# Patient Record
Sex: Female | Born: 1950 | Race: White | Hispanic: No | Marital: Single | Smoking: Never smoker
Health system: Southern US, Community
[De-identification: ages and names within clinical notes are randomized; demographics above are authoritative.]

## PROBLEM LIST (undated history)

## (undated) ENCOUNTER — Ambulatory Visit: Admission: EM | Source: Home / Self Care

## (undated) DIAGNOSIS — F329 Major depressive disorder, single episode, unspecified: Secondary | ICD-10-CM

## (undated) DIAGNOSIS — R519 Headache, unspecified: Secondary | ICD-10-CM

## (undated) DIAGNOSIS — K589 Irritable bowel syndrome without diarrhea: Secondary | ICD-10-CM

## (undated) DIAGNOSIS — E079 Disorder of thyroid, unspecified: Secondary | ICD-10-CM

## (undated) DIAGNOSIS — F32A Depression, unspecified: Secondary | ICD-10-CM

## (undated) DIAGNOSIS — D649 Anemia, unspecified: Secondary | ICD-10-CM

## (undated) DIAGNOSIS — I10 Essential (primary) hypertension: Secondary | ICD-10-CM

## (undated) DIAGNOSIS — E119 Type 2 diabetes mellitus without complications: Secondary | ICD-10-CM

## (undated) DIAGNOSIS — R51 Headache: Secondary | ICD-10-CM

## (undated) DIAGNOSIS — K219 Gastro-esophageal reflux disease without esophagitis: Secondary | ICD-10-CM

## (undated) HISTORY — PX: GASTRIC BYPASS: SHX52

## (undated) HISTORY — PX: THYROID SURGERY: SHX805

## (undated) HISTORY — PX: ABDOMINAL HYSTERECTOMY: SHX81

## (undated) HISTORY — PX: OOPHORECTOMY: SHX86

## (undated) HISTORY — PX: CHOLECYSTECTOMY: SHX55

---

## 2004-01-03 ENCOUNTER — Emergency Department: Payer: Self-pay | Admitting: Emergency Medicine

## 2004-03-22 ENCOUNTER — Emergency Department: Payer: Self-pay | Admitting: General Practice

## 2004-04-19 ENCOUNTER — Ambulatory Visit: Payer: Self-pay | Admitting: Pain Medicine

## 2008-09-06 ENCOUNTER — Ambulatory Visit: Payer: Self-pay | Admitting: Internal Medicine

## 2008-11-03 ENCOUNTER — Ambulatory Visit: Payer: Self-pay

## 2009-12-07 ENCOUNTER — Ambulatory Visit: Payer: Self-pay | Admitting: Family Medicine

## 2010-07-06 ENCOUNTER — Ambulatory Visit: Payer: Self-pay | Admitting: Specialist

## 2010-07-26 ENCOUNTER — Ambulatory Visit: Payer: Self-pay | Admitting: Specialist

## 2010-07-28 ENCOUNTER — Ambulatory Visit: Payer: Self-pay | Admitting: Specialist

## 2010-08-11 ENCOUNTER — Ambulatory Visit: Payer: Self-pay | Admitting: Specialist

## 2010-08-21 ENCOUNTER — Ambulatory Visit: Payer: Self-pay | Admitting: Specialist

## 2010-09-08 ENCOUNTER — Ambulatory Visit: Payer: Self-pay | Admitting: Gastroenterology

## 2010-12-01 ENCOUNTER — Ambulatory Visit: Payer: Self-pay | Admitting: Internal Medicine

## 2011-08-28 ENCOUNTER — Ambulatory Visit: Payer: Self-pay | Admitting: Specialist

## 2011-09-12 ENCOUNTER — Ambulatory Visit: Payer: Self-pay | Admitting: Specialist

## 2011-09-12 LAB — CREATININE, SERUM
Creatinine: 0.72 mg/dL (ref 0.60–1.30)
EGFR (African American): >60
EGFR (Non-African Amer.): >60

## 2011-12-27 ENCOUNTER — Ambulatory Visit: Payer: Self-pay | Admitting: Internal Medicine

## 2012-02-12 ENCOUNTER — Ambulatory Visit: Payer: Self-pay | Admitting: Gastroenterology

## 2012-02-23 ENCOUNTER — Ambulatory Visit: Payer: Self-pay | Admitting: Gastroenterology

## 2012-07-19 ENCOUNTER — Emergency Department: Payer: Self-pay | Admitting: Emergency Medicine

## 2012-09-11 ENCOUNTER — Ambulatory Visit: Payer: Self-pay | Admitting: Internal Medicine

## 2012-11-27 ENCOUNTER — Ambulatory Visit: Payer: Self-pay | Admitting: Gastroenterology

## 2013-07-21 ENCOUNTER — Ambulatory Visit: Payer: Self-pay | Admitting: Internal Medicine

## 2015-03-31 ENCOUNTER — Other Ambulatory Visit: Payer: Self-pay | Admitting: Internal Medicine

## 2015-03-31 DIAGNOSIS — Z1231 Encounter for screening mammogram for malignant neoplasm of breast: Secondary | ICD-10-CM

## 2015-03-31 DIAGNOSIS — Z13820 Encounter for screening for osteoporosis: Secondary | ICD-10-CM

## 2015-04-20 ENCOUNTER — Ambulatory Visit: Payer: Self-pay

## 2015-05-06 ENCOUNTER — Other Ambulatory Visit: Payer: Self-pay

## 2015-05-06 ENCOUNTER — Ambulatory Visit: Payer: Self-pay

## 2015-05-17 ENCOUNTER — Other Ambulatory Visit: Payer: Self-pay

## 2015-05-17 ENCOUNTER — Ambulatory Visit: Payer: Self-pay

## 2015-05-30 IMAGING — CR RIGHT FOREARM - 2 VIEW
1 series · 3 of 3 positions shown · non-contrast
Comparison: none

REASON FOR EXAM: fall
COMMENTS:   LMP: Post-Menopausal

[Series 1: ap · 0.17mm/px · 3 of 3 slices shown]
[im 1/3]
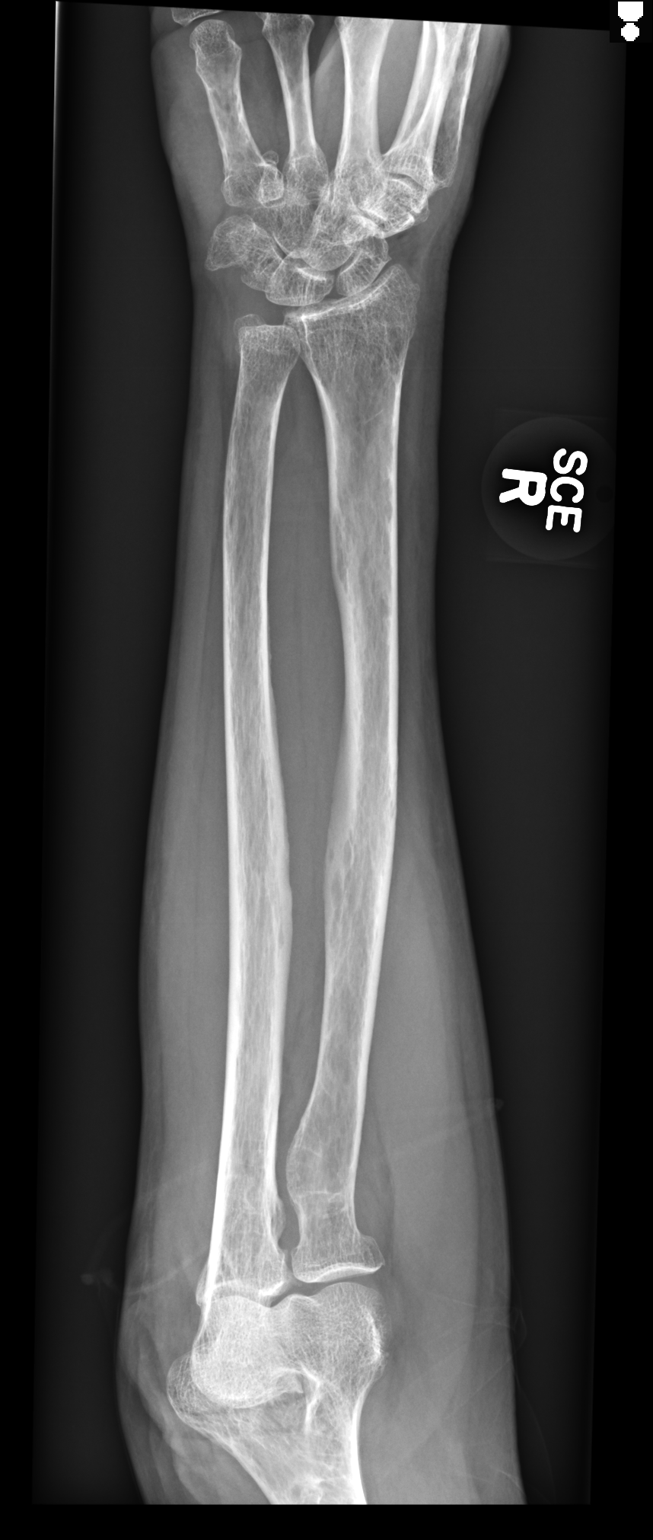
[im 2/3]
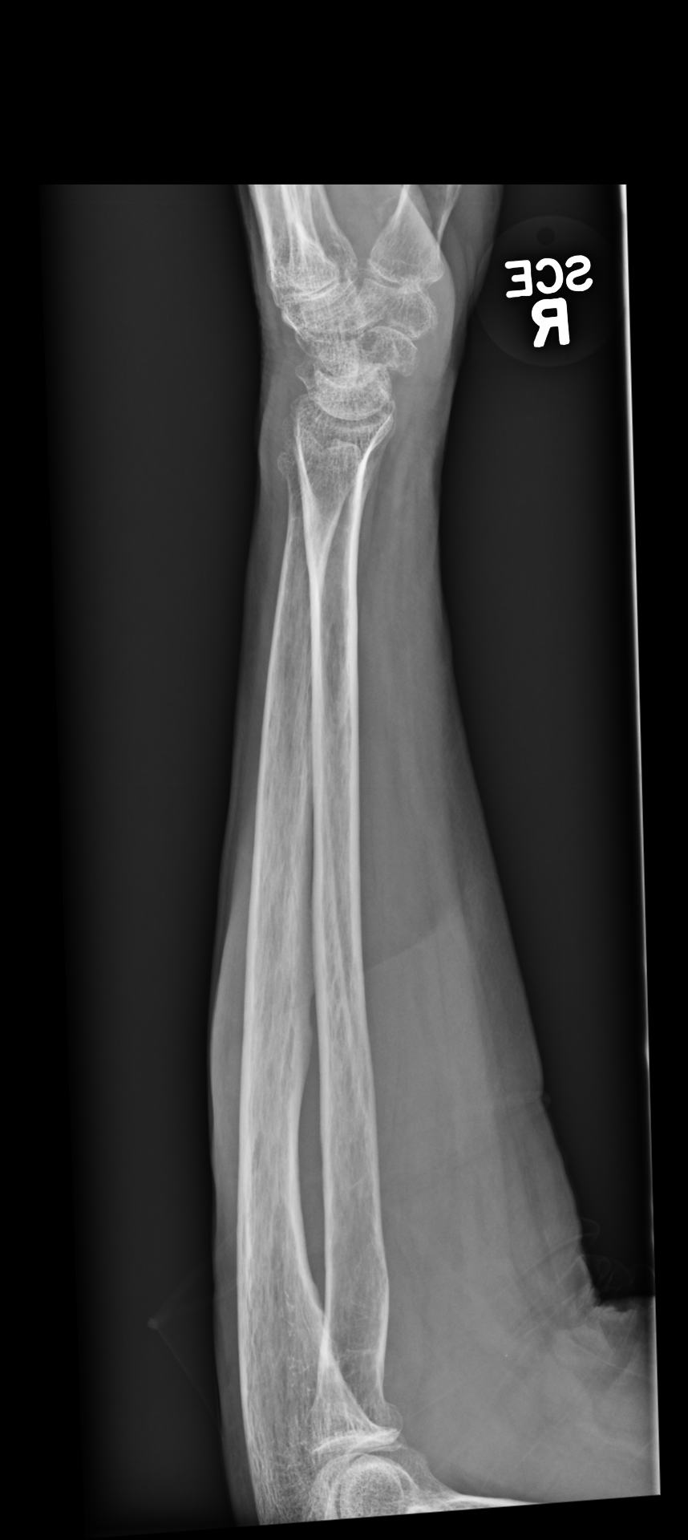
[im 3/3]
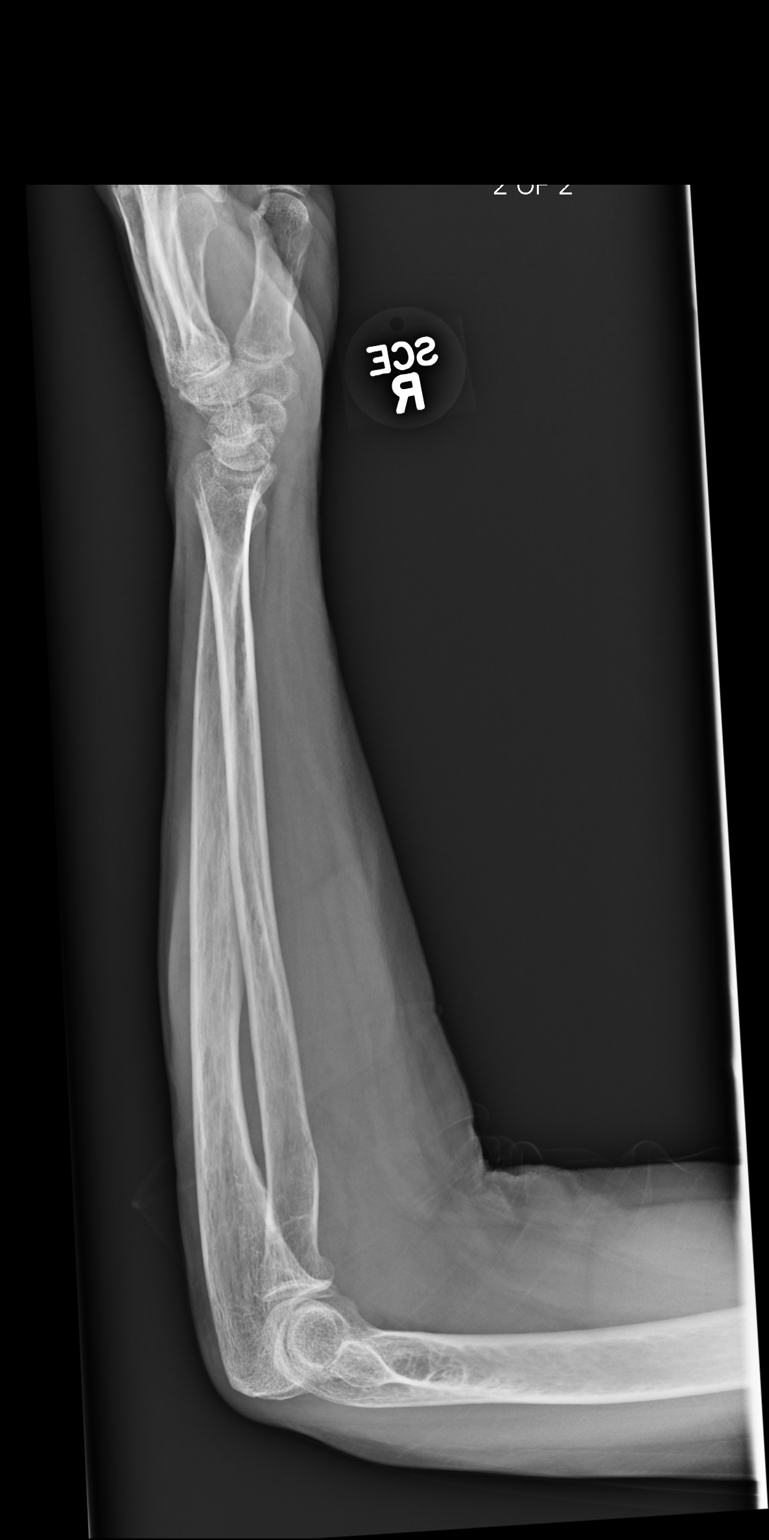

[3 of 3 positions shown; findings below may reference images not displayed]

PROCEDURE:     DXR - DXR FOREARM RIGHT  - July 19, 2012  [DATE]

RESULT:     Images of the right radius and ulna show no definite fracture,
dislocation or radiopaque foreign body. If the patient has symptoms centered
at either the elbow or the wrists, dedicated imaging of those areas is
recommended. Radiocarpal joint space narrowing is present.
IMPRESSION: Please see above.

[REDACTED]

## 2015-09-06 ENCOUNTER — Ambulatory Visit: Admission: RE | Admit: 2015-09-06 | Payer: Self-pay | Source: Ambulatory Visit

## 2016-02-10 ENCOUNTER — Ambulatory Visit: Payer: Self-pay

## 2016-03-23 ENCOUNTER — Ambulatory Visit: Payer: Medicare Other

## 2016-03-23 ENCOUNTER — Encounter: Payer: Self-pay | Admitting: Radiology

## 2016-03-23 ENCOUNTER — Ambulatory Visit
Admission: RE | Admit: 2016-03-23 | Discharge: 2016-03-23 | Disposition: A | Discharge status: Home / Self Care | Payer: Medicare Other | Source: Ambulatory Visit | Attending: Internal Medicine | Admitting: Internal Medicine

## 2016-03-23 ENCOUNTER — Ambulatory Visit
Admission: EM | Admit: 2016-03-23 | Discharge: 2016-03-23 | Disposition: A | Discharge status: Home / Self Care | Payer: Medicare Other | Attending: Family Medicine | Admitting: Family Medicine

## 2016-03-23 ENCOUNTER — Encounter: Payer: Self-pay | Admitting: *Deleted

## 2016-03-23 DIAGNOSIS — K589 Irritable bowel syndrome without diarrhea: Secondary | ICD-10-CM | POA: Diagnosis not present

## 2016-03-23 DIAGNOSIS — Z13820 Encounter for screening for osteoporosis: Secondary | ICD-10-CM | POA: Diagnosis present

## 2016-03-23 DIAGNOSIS — E079 Disorder of thyroid, unspecified: Secondary | ICD-10-CM | POA: Insufficient documentation

## 2016-03-23 DIAGNOSIS — M8580 Other specified disorders of bone density and structure, unspecified site: Secondary | ICD-10-CM | POA: Diagnosis not present

## 2016-03-23 DIAGNOSIS — F329 Major depressive disorder, single episode, unspecified: Secondary | ICD-10-CM | POA: Insufficient documentation

## 2016-03-23 DIAGNOSIS — Z1231 Encounter for screening mammogram for malignant neoplasm of breast: Secondary | ICD-10-CM | POA: Insufficient documentation

## 2016-03-23 DIAGNOSIS — K900 Celiac disease: Secondary | ICD-10-CM | POA: Diagnosis not present

## 2016-03-23 DIAGNOSIS — K219 Gastro-esophageal reflux disease without esophagitis: Secondary | ICD-10-CM | POA: Diagnosis not present

## 2016-03-23 DIAGNOSIS — R296 Repeated falls: Secondary | ICD-10-CM | POA: Insufficient documentation

## 2016-03-23 DIAGNOSIS — S5291XG Unspecified fracture of right forearm, subsequent encounter for closed fracture with delayed healing: Secondary | ICD-10-CM | POA: Diagnosis not present

## 2016-03-23 DIAGNOSIS — Z780 Asymptomatic menopausal state: Secondary | ICD-10-CM | POA: Diagnosis not present

## 2016-03-23 DIAGNOSIS — Z7984 Long term (current) use of oral hypoglycemic drugs: Secondary | ICD-10-CM | POA: Diagnosis not present

## 2016-03-23 DIAGNOSIS — Z833 Family history of diabetes mellitus: Secondary | ICD-10-CM | POA: Insufficient documentation

## 2016-03-23 DIAGNOSIS — Z8249 Family history of ischemic heart disease and other diseases of the circulatory system: Secondary | ICD-10-CM | POA: Diagnosis not present

## 2016-03-23 DIAGNOSIS — W182XXD Fall in (into) shower or empty bathtub, subsequent encounter: Secondary | ICD-10-CM | POA: Diagnosis not present

## 2016-03-23 DIAGNOSIS — S52501G Unspecified fracture of the lower end of right radius, subsequent encounter for closed fracture with delayed healing: Secondary | ICD-10-CM | POA: Diagnosis present

## 2016-03-23 DIAGNOSIS — M810 Age-related osteoporosis without current pathological fracture: Secondary | ICD-10-CM | POA: Insufficient documentation

## 2016-03-23 HISTORY — DX: Major depressive disorder, single episode, unspecified: F32.9

## 2016-03-23 HISTORY — DX: Gastro-esophageal reflux disease without esophagitis: K21.9

## 2016-03-23 HISTORY — DX: Disorder of thyroid, unspecified: E07.9

## 2016-03-23 HISTORY — DX: Irritable bowel syndrome, unspecified: K58.9

## 2016-03-23 HISTORY — DX: Depression, unspecified: F32.A

## 2016-03-23 NOTE — ED Triage Notes (Signed)
Patient injured right wrist on 01/14/16. Wrist is still painful with visible irregularities.

## 2016-03-23 NOTE — ED Provider Notes (Addendum)
[facility]    CSN: [id number] Arrival date & time: 03/23/16  1430     History   Chief Complaint Chief Complaint  Patient presents with  . Wrist Injury    HPI Lisa Livingston is a 66 y.o. female.   Patient reports falling down in her shower about a month ago. She states that she still having difficult to using the right wrist and right hand picking things up she states reports decreased strength and grip about her right hand since then. She is a history of thyroid disease depression and GERD and IBS. Father does have diabetes and heart attack she still smoked. No pertinent family medical history in relationship to this fall   The history is provided by the patient. No language interpreter was used.  Wrist Injury  Location:  Wrist Wrist location:  R wrist Pain details:    Quality:  Sharp   Radiates to:  R wrist   Severity:  Moderate   Duration:  6 weeks   Timing:  Constant   Progression:  Worsening Handedness:  Right-handed Foreign body present:  No foreign bodies Prior injury to area:  Yes Relieved by:  None tried Worsened by:  Nothing Ineffective treatments:  NSAIDs Risk factors: known bone disorder     Past Medical History:  Diagnosis Date  . Depression   . GERD (gastroesophageal reflux disease)   . IBS (irritable bowel syndrome)   . Thyroid disease     There are no active problems to display for this patient.   Past Surgical History:  Procedure Laterality Date  . ABDOMINAL HYSTERECTOMY    . CHOLECYSTECTOMY    . THYROID SURGERY      OB History    No data available       Home Medications    Prior to Admission medications   Medication Sig Start Date End Date Taking? Authorizing Provider  buPROPion (WELLBUTRIN) 75 MG tablet Take 75 mg by mouth daily.   Yes Historical Provider, MD  escitalopram (LEXAPRO) 10 MG tablet Take 10 mg by mouth daily.   Yes Historical Provider, MD  esomeprazole (NEXIUM) 40 MG capsule Take 40 mg by mouth  daily at 12 noon.   Yes Historical Provider, MD  linaclotide (LINZESS) 290 MCG CAPS capsule Take 290 mcg by mouth daily before breakfast.   Yes Historical Provider, MD  loratadine (CLARITIN) 10 MG tablet Take 10 mg by mouth daily.   Yes Historical Provider, MD  metFORMIN (GLUCOPHAGE) 500 MG tablet Take 500 mg by mouth 2 (two) times daily with a meal.   Yes Historical Provider, MD    Family History Family History  Problem Relation Age of Onset  . Diabetes Father   . Heart attack Father   . Breast cancer Neg Hx     Social History Social History  Substance Use Topics  . Smoking status: Never Smoker  . Smokeless tobacco: Never Used  . Alcohol use No     Allergies   Codeine; Milk-related compounds; and Penicillins   Review of Systems Review of Systems  Musculoskeletal: Positive for arthralgias, joint swelling and myalgias.  All other systems reviewed and are negative.    Physical Exam Triage Vital Signs ED Triage Vitals  Enc Vitals Group     BP 03/23/16 1444 117/84     Pulse Rate 03/23/16 1444 74     Resp 03/23/16 1444 16     Temp 03/23/16 1444 98.1 F (36.7 C)  Temp Source 03/23/16 1444 Oral     SpO2 03/23/16 1444 100 %     Weight 03/23/16 1444 146 lb (66.2 kg)     Height 03/23/16 1444 5\' 1"  (1.549 m)     Head Circumference --      Peak Flow --      Pain Score 03/23/16 1454 5     Pain Loc --      Pain Edu? --      Excl. in [place]? --    No data found.   Updated Vital Signs BP 117/84 (BP Location: Left Arm)   Pulse 74   Temp 98.1 F (36.7 C) (Oral)   Resp 16   Ht 5\' 1"  (1.549 m)   Wt 146 lb (66.2 kg)   SpO2 100%   BMI 27.59 kg/m   Visual Acuity Right Eye Distance:   Left Eye Distance:   Bilateral Distance:    Right Eye Near:   Left Eye Near:    Bilateral Near:     Physical Exam  Constitutional: She appears well-developed and well-nourished.  HENT:  Head: Normocephalic.  Eyes: Pupils are equal, round, and reactive to light.  Neck: Normal  range of motion. Neck supple.  Pulmonary/Chest: Effort normal.  Musculoskeletal: She exhibits edema, tenderness and deformity.       Right wrist: She exhibits tenderness and swelling.  We'll x-ray her right wrist and distal forearm . Not worried about navicular fracture since this happened over 4 weeks ago if x-rays negative. Concerned though there is some slight deformity of the wrist area and her muscle tone and strength is diminished  Neurological: She is alert.  Skin: Skin is warm. She is not diaphoretic.  Psychiatric: She has a normal mood and affect.  Vitals reviewed.    UC Treatments / Results  Labs (all labs ordered are listed, but only abnormal results are displayed) Labs Reviewed - No data to display  EKG  EKG Interpretation None       Radiology Dg Wrist Complete Right  Result Date: 03/23/2016 CLINICAL DATA:  Injured wrist in a fall on 02/12/2006 T with persistent pain EXAM: RIGHT WRIST - COMPLETE 3+ VIEW COMPARISON:  Right forearm images of 07/19/2012 FINDINGS: There is subacute transverse slightly impacted fracture of the distal right radius with sclerosis indicating some interval healing. There is very slight ventral angulation at that site. Also there is subacute fracture of the ulnar styloid present. The radiocarpal joint space is unremarkable. The carpal bones are in normal position. The bones are diffusely osteopenic. IMPRESSION: 1. Subacute healing fracture of the distal right radius with slight impaction and mild ventral angulation. 2. Subacute fracture of the ulnar styloid. 3. Osteopenia. Electronically Signed   By: Ivar Drape M.D.   On: 03/23/2016 15:57   Dg Bone Density  Result Date: 03/23/2016 EXAM: DUAL X-RAY ABSORPTIOMETRY (DXA) FOR BONE MINERAL DENSITY IMPRESSION: Dear Lisa Livingston, Your patient Lisa Livingston completed a BMD test on 03/23/2016 using the [employer] (analysis version: 14.10) manufactured by [employer]. The following summarizes  the results of our evaluation. PATIENT BIOGRAPHICAL: Name: Lisa, Livingston Patient ID: [id number] Birth Date: Jan 05, 1951 Height: 61.0 in. Gender: Female Exam Date: 03/23/2016 Weight: 146.7 lbs. Indications: Caucasian, Celiac Disease, Family Hx of Osteoporosis, Hysterectomy, Oophorectomy Bilateral, Parent Hip Fracture, Postmenopausal, Recurrent Falls Fractures: Treatments: Multi-Vitamin with calcium, Vitamin D ASSESSMENT: The BMD measured at Femur Neck Right is 0.676 g/cm2 with a T-score of -2.6. This patient is considered  osteoporotic according to [facility]) criteria. Site Region Measured Measured WHO Young Adult BMD Date       Age      Classification T-score AP Spine L1-L4 2/23/2016 66.6 Osteoporosis -2.6 0.875 g/cm2 DualFemur Neck Right 03/23/2016 66.6 Osteoporosis -2.6 0.676 g/cm2 World Health Organization [facility]) criteria for post-menopausal, Caucasian Women: Normal:       T-score at or above -1 SD Osteopenia:   T-score between -1 and -2.5 SD Osteoporosis: T-score at or below -2.5 SD RECOMMENDATIONS: [place] recommends that FDA-approved medical therapies be considered in postmenopausal women and men age 49 or older with a: 1. Hip or vertebral (clinical or morphometric) fracture. 2. T-score of < -2.5 at the spine or hip. 3. Ten-year fracture probability by FRAX of 3% or greater for hip fracture or 20% or greater for major osteoporotic fracture. All treatment decisions require clinical judgment and consideration of individual patient factors, including patient preferences, co-morbidities, previous drug use, risk factors not captured in the FRAX model (e.g. falls, vitamin D deficiency, increased bone turnover, interval significant decline in bone density) and possible under - or over-estimation of fracture risk by FRAX. All patients should ensure an adequate intake of dietary calcium (1200 mg/d) and vitamin D (800 IU daily) unless contraindicated. FOLLOW-UP: People  with diagnosed cases of osteoporosis or at high risk for fracture should have regular bone mineral density tests. For patients eligible for Medicare, routine testing is allowed once every 2 years. The testing frequency can be increased to one year for patients who have rapidly progressing disease, those who are receiving or discontinuing medical therapy to restore bone mass, or have additional risk factors. I have reviewed this report, and agree with the above findings. [facility]iology Electronically Signed   By: Fidela Salisbury M.D.   On: 03/23/2016 14:15    Procedures Procedures (including critical care time)  Medications Ordered in UC Medications - No data to display   Initial Impression / Assessment and Plan / UC Course  I have reviewed the triage vital signs and the nursing notes.  Pertinent labs & imaging results that were available during my care of the patient were reviewed by me and considered in my medical decision making (see chart for details).    I've explained patient that she has multiple fractures of the right distal forearm. She also has osteopenia. Patient had a bone scan today which she initially did not tell me about. Appearing bone scan was scheduled as an outpatient randomly for today. Explained to her that we will need to get her to orthopedic of her choice to be dilated foot right wrist. We'll place a sugar tong splint and sling for stabilization and wanted the surgery might be needed to get use of that hand and wrist.    Final Clinical Impressions(s) / UC Diagnoses   Final diagnoses:  Multiple closed fractures of forearm with delayed healing, right    New Prescriptions New Prescriptions   No medications on file    Note: This dictation was prepared with Dragon dictation along with smaller phrase technology. Any transcriptional errors that result from this process are unintentional.   Frederich Cha, MD 03/23/16 [place], MD 03/23/16  [phone]

## 2017-03-31 ENCOUNTER — Other Ambulatory Visit: Payer: Self-pay

## 2017-03-31 ENCOUNTER — Ambulatory Visit
Admission: EM | Admit: 2017-03-31 | Discharge: 2017-03-31 | Disposition: A | Discharge status: Home / Self Care | Payer: Medicare Other | Attending: Family Medicine | Admitting: Family Medicine

## 2017-03-31 DIAGNOSIS — K589 Irritable bowel syndrome without diarrhea: Secondary | ICD-10-CM | POA: Diagnosis not present

## 2017-03-31 DIAGNOSIS — Z880 Allergy status to penicillin: Secondary | ICD-10-CM | POA: Diagnosis not present

## 2017-03-31 DIAGNOSIS — R531 Weakness: Secondary | ICD-10-CM | POA: Diagnosis not present

## 2017-03-31 DIAGNOSIS — E079 Disorder of thyroid, unspecified: Secondary | ICD-10-CM | POA: Diagnosis not present

## 2017-03-31 DIAGNOSIS — K219 Gastro-esophageal reflux disease without esophagitis: Secondary | ICD-10-CM | POA: Insufficient documentation

## 2017-03-31 DIAGNOSIS — F329 Major depressive disorder, single episode, unspecified: Secondary | ICD-10-CM | POA: Insufficient documentation

## 2017-03-31 DIAGNOSIS — Z90710 Acquired absence of both cervix and uterus: Secondary | ICD-10-CM | POA: Diagnosis not present

## 2017-03-31 DIAGNOSIS — Z885 Allergy status to narcotic agent status: Secondary | ICD-10-CM | POA: Insufficient documentation

## 2017-03-31 DIAGNOSIS — Z8249 Family history of ischemic heart disease and other diseases of the circulatory system: Secondary | ICD-10-CM | POA: Diagnosis not present

## 2017-03-31 DIAGNOSIS — Z9049 Acquired absence of other specified parts of digestive tract: Secondary | ICD-10-CM | POA: Diagnosis not present

## 2017-03-31 DIAGNOSIS — Z833 Family history of diabetes mellitus: Secondary | ICD-10-CM | POA: Insufficient documentation

## 2017-03-31 DIAGNOSIS — R079 Chest pain, unspecified: Secondary | ICD-10-CM

## 2017-03-31 DIAGNOSIS — Z79899 Other long term (current) drug therapy: Secondary | ICD-10-CM | POA: Insufficient documentation

## 2017-03-31 DIAGNOSIS — Z9884 Bariatric surgery status: Secondary | ICD-10-CM | POA: Diagnosis not present

## 2017-03-31 MED ORDER — ISOSORBIDE MONONITRATE ER 30 MG PO TB24: 30.0000 mg | ORAL_TABLET | Freq: Every day | ORAL | 0 refills | Status: DC

## 2017-03-31 MED ORDER — NITROGLYCERIN 0.4 MG SL SUBL: 0.4000 mg | SUBLINGUAL_TABLET | SUBLINGUAL | 0 refills | Status: DC | PRN

## 2017-03-31 NOTE — ED Provider Notes (Signed)
[facility]    CSN: [id number] Arrival date & time: 03/31/17  1357     History   Chief Complaint Chief Complaint  Patient presents with  . Weakness    HPI Lisa Livingston is a 67 y.o. female.   67 yo female with a c/o intermittent chest tightness on and off for the past week. States she last felt this today in the morning. Currently states she's not having any chest tightness or pressure. Denies shortness of breath, fevers, chills, falls/injuries, rash, nausea/vomiting, GERD, abdominal pain.  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    The history is provided by the patient.  Weakness     Past Medical History:  Diagnosis Date  . Depression   . GERD (gastroesophageal reflux disease)   . IBS (irritable bowel syndrome)   . Thyroid disease     There are no active problems to display for this patient.   Past Surgical History:  Procedure Laterality Date  . ABDOMINAL HYSTERECTOMY    . CHOLECYSTECTOMY    . GASTRIC BYPASS    . THYROID SURGERY      OB History    No data available       Home Medications    Prior to Admission medications   Medication Sig Start Date End Date Taking? Authorizing Provider  buPROPion (WELLBUTRIN) 75 MG tablet Take 75 mg by mouth daily.    [provider]  escitalopram (LEXAPRO) 10 MG tablet Take 10 mg by mouth daily.    [provider]  esomeprazole (NEXIUM) 40 MG capsule Take 40 mg by mouth daily at 12 noon.    [provider]  isosorbide mononitrate (IMDUR) 30 MG 24 hr tablet Take 1 tablet (30 mg total) by mouth daily. 03/31/17   Norval Gable, MD  linaclotide (LINZESS) 290 MCG CAPS capsule Take 290 mcg by mouth daily before breakfast.    [provider]  loratadine (CLARITIN) 10 MG tablet Take 10 mg by mouth daily.    [provider]  metFORMIN (GLUCOPHAGE) 500 MG tablet Take 500 mg by mouth 2 (two) times daily with a meal.    [provider]    nitroGLYCERIN (NITROSTAT) 0.4 MG SL tablet Place 1 tablet (0.4 mg total) under the tongue every 5 (five) minutes as needed for chest pain. 03/31/17   Norval Gable, MD    Family History Family History  Problem Relation Age of Onset  . Diabetes Father   . Heart attack Father   . Breast cancer Neg Hx     Social History Social History   Tobacco Use  . Smoking status: Never Smoker  . Smokeless tobacco: Never Used  Substance Use Topics  . Alcohol use: No  . Drug use: No     Allergies   Codeine; Milk-related compounds; and Penicillins   Review of Systems Review of Systems  Neurological: Positive for weakness.     Physical Exam Triage Vital Signs ED Triage Vitals  Enc Vitals Group     BP 03/31/17 1407 (!) 136/59     Pulse Rate 03/31/17 1407 74     Resp 03/31/17 1407 18     Temp 03/31/17 1407 97.7 F (36.5 C)     Temp Source 03/31/17 1407 Oral     SpO2 03/31/17 1407 100 %     Weight 03/31/17 1417 145 lb (65.8 kg)     Height 03/31/17 1417 5' 1.5" (1.562 m)     Head Circumference --  Peak Flow --      Pain Score 03/31/17 1417 1     Pain Loc --      Pain Edu? --      Excl. in [place]? --    No data found.  Updated Vital Signs BP (!) 136/59 (BP Location: Left Arm)   Pulse 74   Temp 97.7 F (36.5 C) (Oral)   Resp 18   Ht 5' 1.5" (1.562 m)   Wt 145 lb (65.8 kg)   SpO2 100%   BMI 26.95 kg/m   Visual Acuity Right Eye Distance:   Left Eye Distance:   Bilateral Distance:    Right Eye Near:   Left Eye Near:    Bilateral Near:     Physical Exam  Constitutional: She appears well-developed and well-nourished. No distress.  Cardiovascular: Normal rate, regular rhythm, normal heart sounds and intact distal pulses.  Pulmonary/Chest: Effort normal and breath sounds normal. No stridor. No respiratory distress. She has no wheezes. She has no rales.  Abdominal: Soft.  Skin: She is not diaphoretic.  Nursing note and vitals reviewed.    UC Treatments / Results   Labs (all labs ordered are listed, but only abnormal results are displayed) Labs Reviewed - No data to display  EKG  EKG Interpretation None       Radiology No results found.  Procedures ED EKG Date/Time: 03/31/2017 4:16 PM Performed by: Norval Gable, MD Authorized by: Norval Gable, MD   ECG reviewed by ED Physician in the absence of a cardiologist: yes   Previous ECG:    Previous ECG:  Compared to current   Comparison ECG info:  2012   Similarity:  No change Interpretation:    Interpretation: abnormal   Rate:    ECG rate assessment: normal   Rhythm:    Rhythm: sinus rhythm   Ectopy:    Ectopy: none   QRS:    QRS axis:  Normal Conduction:    Conduction: normal   ST segments:    ST segments:  Normal T waves:    T waves: normal   Q waves:    Q waves:  III, aVF and V1    (including critical care time)  Medications Ordered in UC Medications - No data to display   Initial Impression / Assessment and Plan / UC Course  I have reviewed the triage vital signs and the nursing notes.  Pertinent labs & imaging results that were available during my care of the patient were reviewed by me and considered in my medical decision making (see chart for details).       Final Clinical Impressions(s) / UC Diagnoses   Final diagnoses:  Chest pain, unspecified type    ED Discharge Orders        Ordered    isosorbide mononitrate (IMDUR) 30 MG 24 hr tablet  Daily     03/31/17 1527    nitroGLYCERIN (NITROSTAT) 0.4 MG SL tablet  Every 5 min PRN     03/31/17 1527    1. ekg results and diagnosis reviewed with patient 2. rx as per orders above; reviewed possible side effects, interactions, risks and benefits  3. Recommend continue baby ASA daily  4. Follow-up with cardiologist and PCP for further evaluation and management   Controlled Substance Prescriptions [place] Controlled Substance Registry consulted? Not Applicable   Norval Gable, MD 03/31/17 [phone]

## 2017-03-31 NOTE — ED Triage Notes (Addendum)
Pt with weakness and chest tightness and intermittently and describes the pain when it comes as little stabs. Symptoms going on x one week. No aggravating or alleviating factors.

## 2017-03-31 NOTE — Discharge Instructions (Signed)
Follow up with cardiologist

## 2017-04-03 ENCOUNTER — Telehealth: Payer: Self-pay | Admitting: Emergency Medicine

## 2017-04-03 NOTE — Telephone Encounter (Signed)
Called to follow up after patient's recent visit. Patient states she is feeling better and has appointment with Cardiology at [facility] tomorrow.

## 2017-04-04 DIAGNOSIS — R002 Palpitations: Secondary | ICD-10-CM | POA: Insufficient documentation

## 2017-04-04 DIAGNOSIS — R079 Chest pain, unspecified: Secondary | ICD-10-CM | POA: Insufficient documentation

## 2017-05-29 ENCOUNTER — Other Ambulatory Visit: Payer: Self-pay | Admitting: Internal Medicine

## 2017-05-29 DIAGNOSIS — Z1231 Encounter for screening mammogram for malignant neoplasm of breast: Secondary | ICD-10-CM

## 2017-05-30 ENCOUNTER — Encounter: Payer: Self-pay | Admitting: Internal Medicine

## 2017-06-20 ENCOUNTER — Ambulatory Visit
Admission: RE | Admit: 2017-06-20 | Discharge: 2017-06-20 | Disposition: A | Discharge status: Home / Self Care | Payer: Medicare Other | Source: Ambulatory Visit | Attending: Internal Medicine | Admitting: Internal Medicine

## 2017-06-20 DIAGNOSIS — Z1231 Encounter for screening mammogram for malignant neoplasm of breast: Secondary | ICD-10-CM | POA: Insufficient documentation

## 2017-07-23 ENCOUNTER — Other Ambulatory Visit: Payer: Self-pay

## 2017-07-23 ENCOUNTER — Encounter: Payer: Self-pay | Admitting: Gastroenterology

## 2017-07-23 ENCOUNTER — Encounter (INDEPENDENT_AMBULATORY_CARE_PROVIDER_SITE_OTHER): Payer: Self-pay

## 2017-07-23 ENCOUNTER — Ambulatory Visit (INDEPENDENT_AMBULATORY_CARE_PROVIDER_SITE_OTHER): Payer: Medicare Other | Admitting: Gastroenterology

## 2017-07-23 VITALS — BP 140/64 | HR 62 | Ht 61.5 in | Wt 153.0 lb

## 2017-07-23 DIAGNOSIS — K5900 Constipation, unspecified: Secondary | ICD-10-CM | POA: Diagnosis not present

## 2017-07-23 DIAGNOSIS — R112 Nausea with vomiting, unspecified: Secondary | ICD-10-CM

## 2017-07-23 DIAGNOSIS — R194 Change in bowel habit: Secondary | ICD-10-CM | POA: Diagnosis not present

## 2017-07-23 DIAGNOSIS — K432 Incisional hernia without obstruction or gangrene: Secondary | ICD-10-CM | POA: Insufficient documentation

## 2017-07-23 DIAGNOSIS — R140 Abdominal distension (gaseous): Secondary | ICD-10-CM

## 2017-07-23 NOTE — Progress Notes (Signed)
Gastroenterology Consultation  Referring Provider:     Ellamae Sia, MD Primary Care Physician:  Ellamae Sia, MD Primary Gastroenterologist:  Dr. Allen Norris     Reason for Consultation:     Bloating and constipation        HPI:   Lisa Livingston is a 67 y.o. y/o female referred for consultation & management of bloating and constipation by Dr. Quay Burow, Ala Dach, MD.  This patient comes in today for a history of constipation with bloating.  The patient states that she has been taking 290 mcg of Linzess which had been working for some time but now states that she has to supplement it with a laxative pill every morning.  The patient denies any unexplained weight loss.  She states that she has smaller caliber stools.  She also reports that she had gastric bypass surgery in 2012.  She is concerned because she has a lot of nausea and abdominal pain.  She sometimes has to lay down when she has abdominal pain.  The nausea will happen shortly after she eats.  There is no report of any worry symptoms such as black stools or bloody stools.  She also denies any unexplained weight loss. The patient's last colonoscopy was in 2015 and at that time she had multiple tubular adenomas and was told to have a repeat colonoscopy in 1 year.  Past Medical History:  Diagnosis Date  . Depression   . GERD (gastroesophageal reflux disease)   . IBS (irritable bowel syndrome)   . Thyroid disease     Past Surgical History:  Procedure Laterality Date  . ABDOMINAL HYSTERECTOMY    . CHOLECYSTECTOMY    . GASTRIC BYPASS    . OOPHORECTOMY    . THYROID SURGERY      Prior to Admission medications   Medication Sig Start Date End Date Taking? Authorizing Provider  aspirin EC 81 MG tablet Take by mouth.   Yes [provider]  atorvastatin (LIPITOR) 20 MG tablet Take by mouth.   Yes [provider]  Biotin (SUPER BIOTIN) 5 MG TABS Take by mouth.   Yes [provider]  buPROPion  (WELLBUTRIN) 75 MG tablet Take 75 mg by mouth daily.   Yes [provider]  Cholecalciferol (VITAMIN D3) 1000 units CAPS Take by mouth.   Yes [provider]  cyanocobalamin (,VITAMIN B-12,) 1000 MCG/ML injection Inject as directed every thirty (30) days.   Yes [provider]  eletriptan (RELPAX) 40 MG tablet Take by mouth.   Yes [provider]  EPINEPHrine 0.3 mg/0.3 mL IJ SOAJ injection Inject into the skin.   Yes [provider]  escitalopram (LEXAPRO) 10 MG tablet Take 10 mg by mouth daily.   Yes [provider]  esomeprazole (NEXIUM) 40 MG capsule Take 40 mg by mouth daily at 12 noon.   Yes [provider]  ibuprofen (ADVIL,MOTRIN) 200 MG tablet Take by mouth.   Yes [provider]  Lactobacillus Rhamnosus, GG, (CULTURELLE) CAPS Take by mouth.   Yes [provider]  linaclotide (LINZESS) 290 MCG CAPS capsule Take 290 mcg by mouth daily before breakfast.   Yes [provider]  loratadine (CLARITIN) 10 MG tablet Take 10 mg by mouth daily.   Yes [provider]  LORazepam (ATIVAN) 0.5 MG tablet lorazepam 0.5 mg tablet  TK 1 T PO TID PRN   Yes [provider]  metFORMIN (GLUCOPHAGE) 500 MG tablet Take 500 mg  by mouth 2 (two) times daily with a meal.   Yes [provider]  metoprolol tartrate (LOPRESSOR) 50 MG tablet metoprolol tartrate 50 mg tablet  Take 1 tablet twice a day by oral route.   Yes [provider]  Multiple Vitamin (MULTIVITAMIN) capsule Take by mouth.   Yes [provider]  nitroGLYCERIN (NITROSTAT) 0.4 MG SL tablet Place 1 tablet (0.4 mg total) under the tongue every 5 (five) minutes as needed for chest pain. 03/31/17  Yes Norval Gable, MD  ondansetron (ZOFRAN) 4 MG tablet Take by mouth.   Yes [provider]  simvastatin (ZOCOR) 10 MG tablet Take by mouth.   Yes [provider]  traZODone (DESYREL) 50 MG tablet Take by mouth.    Yes [provider]  DOCOSAHEXAENOIC ACID PO Take by mouth.    [provider]  isosorbide mononitrate (IMDUR) 30 MG 24 hr tablet Take 1 tablet (30 mg total) by mouth daily. Patient not taking: Reported on 07/23/2017 03/31/17   Norval Gable, MD  omeprazole (PRILOSEC) 20 MG capsule Take by mouth.    [provider]  Ubiquinol 200 MG CAPS Take by mouth.    [provider]    Family History  Problem Relation Age of Onset  . Diabetes Father   . Heart attack Father   . Breast cancer Neg Hx      Social History   Tobacco Use  . Smoking status: Never Smoker  . Smokeless tobacco: Never Used  Substance Use Topics  . Alcohol use: No  . Drug use: No    Allergies as of 07/23/2017 - Review Complete 07/23/2017  Allergen Reaction Noted  . Aluminum Anaphylaxis 02/10/2013  . Cheese flavor Anaphylaxis 02/10/2013  . Codeine Palpitations 03/23/2016  . Milk-related compounds Anaphylaxis, Diarrhea, and Nausea And Vomiting 02/10/2013  . Molds & smuts Anaphylaxis 02/10/2013  . Oxycodone-acetaminophen Shortness Of Breath 04/04/2017  . Penicillins Hives 03/23/2016  . Sulfa antibiotics Hives and Rash 02/10/2013  . Gluten meal Nausea Only 02/10/2013  . Latex Rash 02/10/2013  . Meperidine Nausea And Vomiting 02/10/2013    Review of Systems:    All systems reviewed and negative except where noted in HPI.   Physical Exam:  BP 140/64   Pulse 62   Ht 5' 1.5" (1.562 m)   Wt 153 lb (69.4 kg)   BMI 28.44 kg/m  No LMP recorded. Patient has had a hysterectomy. Psych:  Alert and cooperative. Normal mood and affect. General:   Alert,  Well-developed, well-nourished, pleasant and cooperative in NAD Head:  Normocephalic and atraumatic. Eyes:  Sclera clear, no icterus.   Conjunctiva pink. Ears:  Normal auditory acuity. Nose:  No deformity, discharge, or lesions. Mouth:  No deformity or lesions,oropharynx pink & moist. Neck:  Supple; no masses or thyromegaly. Lungs:   Respirations even and unlabored.  Clear throughout to auscultation.   No wheezes, crackles, or rhonchi. No acute distress. Heart:  Regular rate and rhythm; no murmurs, clicks, rubs, or gallops. Abdomen:  Normal bowel sounds.  No bruits.  Soft, non-tender and non-distended without masses, hepatosplenomegaly or hernias noted.  No guarding or rebound tenderness.  Negative Carnett sign.   Rectal:  Deferred.  Msk:  Symmetrical without gross deformities.  Good, equal movement & strength bilaterally. Pulses:  Normal pulses noted. Extremities:  No clubbing or edema.  No cyanosis. Neurologic:  Alert and oriented x3;  grossly normal neurologically. Skin:  Intact without significant lesions or rashes.  No jaundice. Lymph Nodes:  No significant cervical adenopathy. Psych:  Alert and cooperative. Normal mood and affect.  Imaging Studies: No results found.  Assessment and Plan:   Lisa Livingston is a 67 y.o. y/o female who has a history of gastric bypass surgery with the loss of 50 pounds after the surgery.  The patient has not continued to lose any weight but reports that she is bloated and has nausea with abdominal pain after eating.  The patient also states that her stools are harder than usual and smaller in caliber.  She also reports that the Linzess that she was taking before has not been working as well as it had in the past. She was last procedure was a colonoscopy by Dr. Candace Cruise with multiple tubular adenomas. The patient has been told to take the [place] in the morning with food which can increase the effectiveness of the Linzess.  She has also been told to stay away from stimulant laxatives and to try MiraLAX daily instead.  The patient has also been told that she will need an EGD and colonoscopy due to her chronic nausea abdominal pain and history of colon polyps. I have discussed risks & benefits which include, but are not limited to, bleeding, infection, perforation & drug reaction.  The patient  agrees with this plan & written consent will be obtained.     Lucilla Lame, MD. Marval Regal   Note: This dictation was prepared with Dragon dictation along with smaller phrase technology. Any transcriptional errors that result from this process are unintentional.

## 2017-07-26 ENCOUNTER — Ambulatory Visit: Payer: Medicare Other | Admitting: Anesthesiology

## 2017-07-26 ENCOUNTER — Encounter: Payer: Self-pay | Admitting: *Deleted

## 2017-07-26 ENCOUNTER — Encounter
Admission: RE | Disposition: A | Discharge status: Home / Self Care | Payer: Self-pay | Source: Ambulatory Visit | Attending: Gastroenterology

## 2017-07-26 ENCOUNTER — Ambulatory Visit
Admission: RE | Admit: 2017-07-26 | Discharge: 2017-07-26 | Disposition: A | Discharge status: Home / Self Care | Payer: Medicare Other | Source: Ambulatory Visit | Attending: Gastroenterology | Admitting: Gastroenterology

## 2017-07-26 DIAGNOSIS — R110 Nausea: Secondary | ICD-10-CM | POA: Insufficient documentation

## 2017-07-26 DIAGNOSIS — Z860100 Personal history of colon polyps, unspecified: Secondary | ICD-10-CM

## 2017-07-26 DIAGNOSIS — I10 Essential (primary) hypertension: Secondary | ICD-10-CM | POA: Insufficient documentation

## 2017-07-26 DIAGNOSIS — Z7984 Long term (current) use of oral hypoglycemic drugs: Secondary | ICD-10-CM | POA: Diagnosis not present

## 2017-07-26 DIAGNOSIS — Z1211 Encounter for screening for malignant neoplasm of colon: Secondary | ICD-10-CM | POA: Insufficient documentation

## 2017-07-26 DIAGNOSIS — D123 Benign neoplasm of transverse colon: Secondary | ICD-10-CM | POA: Insufficient documentation

## 2017-07-26 DIAGNOSIS — R112 Nausea with vomiting, unspecified: Secondary | ICD-10-CM

## 2017-07-26 DIAGNOSIS — Z79899 Other long term (current) drug therapy: Secondary | ICD-10-CM | POA: Insufficient documentation

## 2017-07-26 DIAGNOSIS — Z7982 Long term (current) use of aspirin: Secondary | ICD-10-CM | POA: Diagnosis not present

## 2017-07-26 DIAGNOSIS — K219 Gastro-esophageal reflux disease without esophagitis: Secondary | ICD-10-CM | POA: Diagnosis not present

## 2017-07-26 DIAGNOSIS — R194 Change in bowel habit: Secondary | ICD-10-CM

## 2017-07-26 DIAGNOSIS — G43909 Migraine, unspecified, not intractable, without status migrainosus: Secondary | ICD-10-CM | POA: Insufficient documentation

## 2017-07-26 DIAGNOSIS — K641 Second degree hemorrhoids: Secondary | ICD-10-CM | POA: Insufficient documentation

## 2017-07-26 DIAGNOSIS — Z9884 Bariatric surgery status: Secondary | ICD-10-CM | POA: Insufficient documentation

## 2017-07-26 DIAGNOSIS — K589 Irritable bowel syndrome without diarrhea: Secondary | ICD-10-CM | POA: Diagnosis not present

## 2017-07-26 DIAGNOSIS — E119 Type 2 diabetes mellitus without complications: Secondary | ICD-10-CM | POA: Diagnosis not present

## 2017-07-26 DIAGNOSIS — F329 Major depressive disorder, single episode, unspecified: Secondary | ICD-10-CM | POA: Insufficient documentation

## 2017-07-26 DIAGNOSIS — R140 Abdominal distension (gaseous): Secondary | ICD-10-CM | POA: Diagnosis not present

## 2017-07-26 HISTORY — DX: Anemia, unspecified: D64.9

## 2017-07-26 HISTORY — DX: Essential (primary) hypertension: I10

## 2017-07-26 HISTORY — DX: Headache, unspecified: R51.9

## 2017-07-26 HISTORY — PX: POLYPECTOMY: SHX5525

## 2017-07-26 HISTORY — DX: Type 2 diabetes mellitus without complications: E11.9

## 2017-07-26 HISTORY — DX: Headache: R51

## 2017-07-26 HISTORY — PX: ESOPHAGOGASTRODUODENOSCOPY (EGD) WITH PROPOFOL: SHX5813

## 2017-07-26 HISTORY — PX: COLONOSCOPY WITH PROPOFOL: SHX5780

## 2017-07-26 LAB — GLUCOSE, CAPILLARY: GLUCOSE-CAPILLARY: 170 mg/dL — AB (ref 65–99)

## 2017-07-26 SURGERY — COLONOSCOPY WITH PROPOFOL
Anesthesia: General

## 2017-07-26 MED ORDER — GLYCOPYRROLATE 0.2 MG/ML IJ SOLN
INTRAMUSCULAR | Status: DC | PRN
  Administered 2017-07-26: 0.1 mg via INTRAVENOUS

## 2017-07-26 MED ORDER — ONDANSETRON HCL 4 MG/2ML IJ SOLN
4.0000 mg | Freq: Once | INTRAMUSCULAR | Status: DC | PRN
Start: 2017-07-26 — End: 2017-07-26

## 2017-07-26 MED ORDER — LIDOCAINE HCL (CARDIAC) PF 100 MG/5ML IV SOSY
PREFILLED_SYRINGE | INTRAVENOUS | Status: DC | PRN
  Administered 2017-07-26: 20 mg via INTRAVENOUS

## 2017-07-26 MED ORDER — SODIUM CHLORIDE 0.9 % IV SOLN: INTRAVENOUS | Status: DC

## 2017-07-26 MED ORDER — PROPOFOL 10 MG/ML IV BOLUS
INTRAVENOUS | Status: DC | PRN
Start: 2017-07-26 — End: 2017-07-26
  Administered 2017-07-26 (×2): 20 mg via INTRAVENOUS
  Administered 2017-07-26 (×2): 50 mg via INTRAVENOUS
  Administered 2017-07-26 (×2): 20 mg via INTRAVENOUS
  Administered 2017-07-26: 30 mg via INTRAVENOUS
  Administered 2017-07-26: 60 mg via INTRAVENOUS
  Administered 2017-07-26: 30 mg via INTRAVENOUS
  Administered 2017-07-26 (×2): 20 mg via INTRAVENOUS
  Administered 2017-07-26: 50 mg via INTRAVENOUS
  Administered 2017-07-26 (×2): 20 mg via INTRAVENOUS
  Administered 2017-07-26: 30 mg via INTRAVENOUS
  Administered 2017-07-26: 20 mg via INTRAVENOUS

## 2017-07-26 MED ORDER — ACETAMINOPHEN 160 MG/5ML PO SOLN: 325.0000 mg | ORAL | Status: DC | PRN

## 2017-07-26 MED ORDER — STERILE WATER FOR IRRIGATION IR SOLN
Status: DC | PRN
  Administered 2017-07-26: 11:00:00

## 2017-07-26 MED ORDER — ACETAMINOPHEN 325 MG PO TABS: 650.0000 mg | ORAL_TABLET | Freq: Once | ORAL | Status: DC | PRN

## 2017-07-26 MED ORDER — LACTATED RINGERS IV SOLN
INTRAVENOUS | Status: DC
  Administered 2017-07-26 (×2): via INTRAVENOUS

## 2017-07-26 SURGICAL SUPPLY — 36 items
BALLN DILATOR 10-12 8 (BALLOONS)
BALLN DILATOR 12-15 8 (BALLOONS)
BALLN DILATOR 15-18 8 (BALLOONS)
BALLN DILATOR CRE 0-12 8 (BALLOONS)
BALLN DILATOR ESOPH 8 10 CRE (MISCELLANEOUS) IMPLANT
BALLOON DILATOR 12-15 8 (BALLOONS) IMPLANT
BALLOON DILATOR 15-18 8 (BALLOONS) IMPLANT
BALLOON DILATOR CRE 0-12 8 (BALLOONS) IMPLANT
BLOCK BITE 60FR ADLT L/F GRN (MISCELLANEOUS) ×3 IMPLANT
CANISTER SUCT 1200ML W/VALVE (MISCELLANEOUS) ×3 IMPLANT
CLIP HMST 235XBRD CATH ROT (MISCELLANEOUS) IMPLANT
CLIP RESOLUTION 360 11X235 (MISCELLANEOUS)
ELECT REM PT RETURN 9FT ADLT (ELECTROSURGICAL)
ELECTRODE REM PT RTRN 9FT ADLT (ELECTROSURGICAL) IMPLANT
FCP ESCP3.2XJMB 240X2.8X (MISCELLANEOUS)
FORCEPS BIOP RAD 4 LRG CAP 4 (CUTTING FORCEPS) IMPLANT
FORCEPS BIOP RJ4 240 W/NDL (MISCELLANEOUS)
FORCEPS ESCP3.2XJMB 240X2.8X (MISCELLANEOUS) IMPLANT
GOWN CVR UNV OPN BCK APRN NK (MISCELLANEOUS) ×2 IMPLANT
GOWN ISOL THUMB LOOP REG UNIV (MISCELLANEOUS) ×4
INJECTOR VARIJECT VIN23 (MISCELLANEOUS) IMPLANT
KIT DEFENDO VALVE AND CONN (KITS) IMPLANT
KIT ENDO PROCEDURE OLY (KITS) ×3 IMPLANT
MARKER SPOT ENDO TATTOO 5ML (MISCELLANEOUS) IMPLANT
PROBE APC STR FIRE (PROBE) IMPLANT
RETRIEVER NET PLAT FOOD (MISCELLANEOUS) IMPLANT
RETRIEVER NET ROTH 2.5X230 LF (MISCELLANEOUS) IMPLANT
SNARE SHORT THROW 13M SML OVAL (MISCELLANEOUS) IMPLANT
SNARE SHORT THROW 30M LRG OVAL (MISCELLANEOUS) ×3 IMPLANT
SNARE SNG USE RND 15MM (INSTRUMENTS) IMPLANT
SPOT EX ENDOSCOPIC TATTOO (MISCELLANEOUS)
SYR INFLATION 60ML (SYRINGE) IMPLANT
TRAP ETRAP POLY (MISCELLANEOUS) IMPLANT
VARIJECT INJECTOR VIN23 (MISCELLANEOUS)
WATER STERILE IRR 250ML POUR (IV SOLUTION) ×3 IMPLANT
WIRE CRE 18-20MM 8CM F G (MISCELLANEOUS) IMPLANT

## 2017-07-26 NOTE — H&P (Signed)
Lucilla Lame, MD [address] [place], [place] [address] [phone] Fax : [phone]  Primary Care Physician:  Ellamae Sia, MD Primary Gastroenterologist:  Dr. Allen Norris  Pre-Procedure History & Physical: HPI:  Lisa Livingston is a 67 y.o. female is here for an endoscopy and colonoscopy.   Past Medical History:  Diagnosis Date  . Anemia   . Depression   . Diabetes mellitus without complication ([place])   . GERD (gastroesophageal reflux disease)   . Headache    migraines  . Hypertension   . IBS (irritable bowel syndrome)   . Thyroid disease     Past Surgical History:  Procedure Laterality Date  . ABDOMINAL HYSTERECTOMY    . CHOLECYSTECTOMY    . GASTRIC BYPASS    . OOPHORECTOMY    . THYROID SURGERY      Prior to Admission medications   Medication Sig Start Date End Date Taking? Authorizing Provider  buPROPion (WELLBUTRIN) 75 MG tablet Take 75 mg by mouth daily.   Yes [provider]  eletriptan (RELPAX) 40 MG tablet Take by mouth.   Yes [provider]  escitalopram (LEXAPRO) 10 MG tablet Take 10 mg by mouth daily.   Yes [provider]  esomeprazole (NEXIUM) 40 MG capsule Take 40 mg by mouth daily at 12 noon.   Yes [provider]  ibuprofen (ADVIL,MOTRIN) 200 MG tablet Take by mouth.   Yes [provider]  loratadine (CLARITIN) 10 MG tablet Take 10 mg by mouth daily.   Yes [provider]  metFORMIN (GLUCOPHAGE) 500 MG tablet Take 500 mg by mouth 2 (two) times daily with a meal.   Yes [provider]  metoprolol tartrate (LOPRESSOR) 50 MG tablet metoprolol tartrate 50 mg tablet  Take 1 tablet twice a day by oral route.   Yes [provider]  ondansetron (ZOFRAN) 4 MG tablet Take by mouth.   Yes [provider]  traZODone (DESYREL) 50 MG tablet Take by mouth.   Yes [provider]  aspirin EC 81 MG tablet Take by mouth.    [provider]    atorvastatin (LIPITOR) 20 MG tablet Take by mouth.    [provider]  Biotin (SUPER BIOTIN) 5 MG TABS Take by mouth.    [provider]  Cholecalciferol (VITAMIN D3) 1000 units CAPS Take by mouth.    [provider]  cyanocobalamin (,VITAMIN B-12,) 1000 MCG/ML injection Inject as directed every thirty (30) days.    [provider]  DOCOSAHEXAENOIC ACID PO Take by mouth.    [provider]  EPINEPHrine 0.3 mg/0.3 mL IJ SOAJ injection Inject into the skin.    [provider]  isosorbide mononitrate (IMDUR) 30 MG 24 hr tablet Take 1 tablet (30 mg total) by mouth daily. Patient not taking: Reported on 07/23/2017 03/31/17   Norval Gable, MD  Lactobacillus Rhamnosus, GG, (CULTURELLE) CAPS Take by mouth.    [provider]  linaclotide (LINZESS) 290 MCG CAPS capsule Take 290 mcg by mouth daily before breakfast.    [provider]  LORazepam (ATIVAN) 0.5 MG tablet lorazepam 0.5 mg tablet  TK 1 T PO TID PRN    [provider]  Multiple Vitamin (MULTIVITAMIN) capsule Take by mouth.    [provider]  nitroGLYCERIN (NITROSTAT) 0.4 MG SL tablet Place 1 tablet (0.4 mg total) under the tongue every 5 (five) minutes as needed for chest pain. Patient not taking: Reported on 07/26/2017  03/31/17   Norval Gable, MD  omeprazole (PRILOSEC) 20 MG capsule Take by mouth.    [provider]  simvastatin (ZOCOR) 10 MG tablet Take by mouth.    [provider]  Ubiquinol 200 MG CAPS Take by mouth.    [provider]    Allergies as of 07/23/2017 - Review Complete 07/23/2017  Allergen Reaction Noted  . Aluminum Anaphylaxis 02/10/2013  . Cheese flavor Anaphylaxis 02/10/2013  . Codeine Palpitations 03/23/2016  . Milk-related compounds Anaphylaxis, Diarrhea, and Nausea And Vomiting 02/10/2013  . Molds & smuts Anaphylaxis 02/10/2013  . Oxycodone-acetaminophen Shortness Of Breath 04/04/2017  . Penicillins  Hives 03/23/2016  . Sulfa antibiotics Hives and Rash 02/10/2013  . Gluten meal Nausea Only 02/10/2013  . Latex Rash 02/10/2013  . Meperidine Nausea And Vomiting 02/10/2013    Family History  Problem Relation Age of Onset  . Diabetes Father   . Heart attack Father   . Breast cancer Neg Hx     Social History   Socioeconomic History  . Marital status: Married    Spouse name: Not on file  . Number of children: Not on file  . Years of education: Not on file  . Highest education level: Not on file  Occupational History  . Not on file  Social Needs  . Financial resource strain: Not on file  . Food insecurity:    Worry: Not on file    Inability: Not on file  . Transportation needs:    Medical: Not on file    Non-medical: Not on file  Tobacco Use  . Smoking status: Never Smoker  . Smokeless tobacco: Never Used  Substance and Sexual Activity  . Alcohol use: No  . Drug use: No  . Sexual activity: Not on file  Lifestyle  . Physical activity:    Days per week: Not on file    Minutes per session: Not on file  . Stress: Not on file  Relationships  . Social connections:    Talks on phone: Not on file    Gets together: Not on file    Attends religious service: Not on file    Active member of club or organization: Not on file    Attends meetings of clubs or organizations: Not on file    Relationship status: Not on file  . Intimate partner violence:    Fear of current or ex partner: Not on file    Emotionally abused: Not on file    Physically abused: Not on file    Forced sexual activity: Not on file  Other Topics Concern  . Not on file  Social History Narrative  . Not on file    Review of Systems: See HPI, otherwise negative ROS  Physical Exam: BP 112/60   Pulse 67   Temp 97.7 F (36.5 C) (Temporal)   Resp 16   Ht 5' 1.5" (1.562 m)   Wt 149 lb 1.6 oz (67.6 kg)   SpO2 99%   BMI 27.72 kg/m  General:   Alert,  pleasant and cooperative in NAD Head:   Normocephalic and atraumatic. Neck:  Supple; no masses or thyromegaly. Lungs:  Clear throughout to auscultation.    Heart:  Regular rate and rhythm. Abdomen:  Soft, nontender and nondistended. Normal bowel sounds, without guarding, and without rebound.   Neurologic:  Alert and  oriented x4;  grossly normal neurologically.  Impression/Plan: Lisa Livingston is here for an endoscopy and colonoscopy to be performed for  nausea, bloating and history of colon polyps  Risks, benefits, limitations, and alternatives regarding  colonoscopy have been reviewed with the patient.  Questions have been answered.  All parties agreeable.   Lucilla Lame, MD  07/26/2017, 9:59 AM

## 2017-07-26 NOTE — Op Note (Signed)
[facility] Gastroenterology Patient Name: Lisa Livingston Procedure Date: 07/26/2017 10:38 AM MRN: [id number] Account #: [id number] Date of Birth: Jan 26, 1951 Admit Type: Outpatient Age: 67 Room: [facility] OR ROOM 01 Gender: Female Note Status: Finalized Procedure:            Colonoscopy Indications:          High risk colon cancer surveillance: Personal history                        of colonic polyps Providers:            Lucilla Lame MD, MD Medicines:            Propofol per Anesthesia Complications:        No immediate complications. Procedure:            Pre-Anesthesia Assessment:                       - Prior to the procedure, a History and Physical was                        performed, and patient medications and allergies were                        reviewed. The patient's tolerance of previous                        anesthesia was also reviewed. The risks and benefits of                        the procedure and the sedation options and risks were                        discussed with the patient. All questions were                        answered, and informed consent was obtained. Prior                        Anticoagulants: The patient has taken no previous                        anticoagulant or antiplatelet agents. ASA Grade                        Assessment: II - A patient with mild systemic disease.                        After reviewing the risks and benefits, the patient was                        deemed in satisfactory condition to undergo the                        procedure.                       After obtaining informed consent, the colonoscope was                        passed under direct vision. Throughout  the procedure,                        the patient's blood pressure, pulse, and oxygen                        saturations were monitored continuously. The Olympus                        Colonoscope 190 [id number]) was introduced through the                         anus and advanced to the the cecum, identified by                        appendiceal orifice and ileocecal valve. The                        colonoscopy was performed without difficulty. The                        patient tolerated the procedure well. The quality of                        the bowel preparation was fair. Findings:      The perianal and digital rectal examinations were normal.      A 6 mm polyp was found in the transverse colon. The polyp was sessile.       The polyp was removed with a cold snare. Resection and retrieval were       complete.      Non-bleeding internal hemorrhoids were found during retroflexion. The       hemorrhoids were Grade II (internal hemorrhoids that prolapse but reduce       spontaneously). Impression:           - Preparation of the colon was fair.                       - One 6 mm polyp in the transverse colon, removed with                        a cold snare. Resected and retrieved.                       - Non-bleeding internal hemorrhoids. Recommendation:       - Discharge patient to home.                       - Resume previous diet.                       - Continue present medications.                       - Await pathology results.                       - Repeat colonoscopy in 5 years for surveillance. Procedure Code(s):    --- Professional ---                       [id number], Colonoscopy, flexible; with removal of tumor(s),  polyp(s), or other lesion(s) by snare technique Diagnosis Code(s):    --- Professional ---                       Z86.010, Personal history of colonic polyps                       D12.3, Benign neoplasm of transverse colon (hepatic                        flexure or splenic flexure) CPT copyright 2017 American Medical Association. All rights reserved. The codes documented in this report are preliminary and upon coder review may  be revised to meet current compliance  requirements. Lucilla Lame MD, MD 07/26/2017 11:16:01 AM This report has been signed electronically. Number of Addenda: 0 Note Initiated On: 07/26/2017 10:38 AM Scope Withdrawal Time: 0 hours 10 minutes 7 seconds  Total Procedure Duration: 0 hours 16 minutes 41 seconds       [facility]

## 2017-07-26 NOTE — Transfer of Care (Signed)
Immediate Anesthesia Transfer of Care Note  Patient: Lisa Livingston  Procedure(s) Performed: COLONOSCOPY WITH PROPOFOL (N/A ) ESOPHAGOGASTRODUODENOSCOPY (EGD) WITH PROPOFOL (N/A ) POLYPECTOMY (N/A )  Patient Location: PACU  Anesthesia Type: General  Level of Consciousness: awake, alert  and patient cooperative  Airway and Oxygen Therapy: Patient Spontanous Breathing and Patient connected to supplemental oxygen  Post-op Assessment: Post-op Vital signs reviewed, Patient's Cardiovascular Status Stable, Respiratory Function Stable, Patent Airway and No signs of Nausea or vomiting  Post-op Vital Signs: Reviewed and stable  Complications: No apparent anesthesia complications

## 2017-07-26 NOTE — Anesthesia Postprocedure Evaluation (Signed)
Anesthesia Post Note  Patient: Lisa Livingston  Procedure(s) Performed: COLONOSCOPY WITH PROPOFOL (N/A ) ESOPHAGOGASTRODUODENOSCOPY (EGD) WITH PROPOFOL (N/A ) POLYPECTOMY (N/A )  Patient location during evaluation: PACU Anesthesia Type: General Level of consciousness: awake and alert, oriented and patient cooperative Pain management: pain level controlled Vital Signs Assessment: post-procedure vital signs reviewed and stable Respiratory status: spontaneous breathing, nonlabored ventilation and respiratory function stable Cardiovascular status: blood pressure returned to baseline and stable Postop Assessment: adequate PO intake Anesthetic complications: no    Darrin Nipper

## 2017-07-26 NOTE — Op Note (Signed)
[facility] Gastroenterology Patient Name: Lisa Livingston Procedure Date: 07/26/2017 10:38 AM MRN: [id number] Account #: [id number] Date of Birth: Jul 11, 1950 Admit Type: Outpatient Age: 67 Room: [facility] OR ROOM 01 Gender: Female Note Status: Finalized Procedure:            Upper GI endoscopy Indications:          Nausea Providers:            Lucilla Lame MD, MD Referring MD:         Remus Blake MD, MD (Referring MD) Medicines:            Propofol per Anesthesia Complications:        No immediate complications. Procedure:            Pre-Anesthesia Assessment:                       - Prior to the procedure, a History and Physical was                        performed, and patient medications and allergies were                        reviewed. The patient's tolerance of previous                        anesthesia was also reviewed. The risks and benefits of                        the procedure and the sedation options and risks were                        discussed with the patient. All questions were                        answered, and informed consent was obtained. Prior                        Anticoagulants: The patient has taken no previous                        anticoagulant or antiplatelet agents. ASA Grade                        Assessment: II - A patient with mild systemic disease.                        After reviewing the risks and benefits, the patient was                        deemed in satisfactory condition to undergo the                        procedure.                       After obtaining informed consent, the endoscope was                        passed under direct vision. Throughout the procedure,  the patient's blood pressure, pulse, and oxygen                        saturations were monitored continuously. The Olympus                        190 Endoscope [id number]) was introduced through the   mouth, and advanced to the second part of duodenum. The                        upper GI endoscopy was accomplished without difficulty.                        The patient tolerated the procedure well. Findings:      The examined esophagus was normal.      Evidence of a gastric bypass was found. A gastric pouch with a normal       size was found. The staple line appeared intact. The gastrojejunal       anastomosis was characterized by an intact staple line. This was       traversed. Removal of a staple was accomplished with a regular forceps.      The examined jejunum was normal. Impression:           - Normal esophagus.                       - Gastric bypass with a normal-sized pouch and intact                        staple line. Gastrojejunal anastomosis characterized by                        an intact staple line.                       - Normal examined jejunum. Recommendation:       - Discharge patient to home.                       - Resume previous diet.                       - Continue present medications.                       - Perform a colonoscopy today. Procedure Code(s):    --- Professional ---                       [phone], Esophagogastroduodenoscopy, flexible, transoral;                        with removal of foreign body(s) Diagnosis Code(s):    --- Professional ---                       R11.0, Nausea CPT copyright 2017 American Medical Association. All rights reserved. The codes documented in this report are preliminary and upon coder review may  be revised to meet current compliance requirements. Lucilla Lame MD, MD 07/26/2017 10:54:46 AM This report has been signed electronically. Number of Addenda: 0 Note Initiated On: 07/26/2017 10:38 AM      [facility]

## 2017-07-26 NOTE — Anesthesia Preprocedure Evaluation (Signed)
Anesthesia Evaluation  Patient identified by MRN, date of birth, ID band Patient awake    Reviewed: Allergy & Precautions, NPO status , Patient's Chart, lab work & pertinent test results  History of Anesthesia Complications Negative for: history of anesthetic complications  Airway Mallampati: IV  TM Distance: >3 FB Neck ROM: Full    Dental  (+) Partial Upper,    Pulmonary neg pulmonary ROS,    Pulmonary exam normal breath sounds clear to auscultation       Cardiovascular Exercise Tolerance: Good hypertension, Normal cardiovascular exam Rhythm:Regular Rate:Normal  Palpitations (normal echo and Holter)   Neuro/Psych  Headaches, PSYCHIATRIC DISORDERS Depression    GI/Hepatic GERD  ,IBS; gastric bypass 2012   Endo/Other  diabetes, Type 2  Renal/GU negative Renal ROS     Musculoskeletal   Abdominal   Peds  Hematology  (+) Blood dyscrasia, anemia ,   Anesthesia Other Findings   Reproductive/Obstetrics                             Anesthesia Physical Anesthesia Plan  ASA: II  Anesthesia Plan: General   Post-op Pain Management:    Induction: Intravenous  PONV Risk Score and Plan: 2 and Propofol infusion and TIVA  Airway Management Planned: Natural Airway  Additional Equipment:   Intra-op Plan:   Post-operative Plan:   Informed Consent: I have reviewed the patients History and Physical, chart, labs and discussed the procedure including the risks, benefits and alternatives for the proposed anesthesia with the patient or authorized representative who has indicated his/her understanding and acceptance.     Plan Discussed with: CRNA  Anesthesia Plan Comments:         Anesthesia Quick Evaluation

## 2017-07-26 NOTE — Anesthesia Procedure Notes (Signed)
Procedure Name: MAC Date/Time: 07/26/2017 10:46 AM Performed by: Lind Guest, CRNA Pre-anesthesia Checklist: Patient identified, Emergency Drugs available, Suction available, Timeout performed and Patient being monitored Patient Re-evaluated:Patient Re-evaluated prior to induction Oxygen Delivery Method: Nasal cannula

## 2017-07-27 ENCOUNTER — Encounter: Payer: Self-pay | Admitting: Gastroenterology

## 2017-07-30 ENCOUNTER — Encounter: Payer: Self-pay | Admitting: Gastroenterology

## 2017-07-31 ENCOUNTER — Encounter: Payer: Self-pay | Admitting: Gastroenterology

## 2018-07-30 ENCOUNTER — Encounter: Payer: Self-pay | Admitting: *Deleted

## 2019-06-08 ENCOUNTER — Ambulatory Visit
Admission: EM | Admit: 2019-06-08 | Discharge: 2019-06-08 | Disposition: A | Discharge status: Home / Self Care | Payer: Medicare Other | Attending: Family Medicine | Admitting: Family Medicine

## 2019-06-08 ENCOUNTER — Encounter: Payer: Self-pay | Admitting: Emergency Medicine

## 2019-06-08 ENCOUNTER — Ambulatory Visit: Payer: Medicare Other

## 2019-06-08 ENCOUNTER — Other Ambulatory Visit: Payer: Self-pay

## 2019-06-08 DIAGNOSIS — M79605 Pain in left leg: Secondary | ICD-10-CM | POA: Diagnosis not present

## 2019-06-08 MED ORDER — GABAPENTIN 300 MG PO CAPS: ORAL_CAPSULE | ORAL | 0 refills | Status: AC

## 2019-06-08 NOTE — Discharge Instructions (Signed)
Medication as directed.  Tylenol as needed.  See Princella Ion.  Take care  Dr. Lacinda Axon

## 2019-06-08 NOTE — ED Triage Notes (Signed)
Patient in today c/o left lower leg pain x 2 weeks. No injury noted. Patient said it started 2 days after getting the Moderna vaccine.

## 2019-06-08 NOTE — ED Provider Notes (Signed)
[facility]    CSN: [id number] Arrival date & time: 06/08/19  1411  History   Chief Complaint Chief Complaint  Patient presents with  . Leg Pain    left lower   HPI 70 year old female presents with left leg pain.  Patient reports that she has had pain in her left lower extremity since 3/18.  She states that this started 2 days after receiving Covid vaccine.  Patient localizes pain to the lateral aspect of the left lower leg.  No calf tenderness.  No redness or swelling.  Patient states that the area feels like it is numb and tingling.  She compares it to when local anesthesia wears off.  Pain 7/10 in severity.  She has taken Advil without relief.  She has yet to see her primary care physician for this.  No other associated symptoms.  No other complaints.  Past Medical History:  Diagnosis Date  . Anemia   . Depression   . Diabetes mellitus without complication ([place])   . GERD (gastroesophageal reflux disease)   . Headache    migraines  . Hypertension   . IBS (irritable bowel syndrome)   . Thyroid disease     Patient Active Problem List   Diagnosis Date Noted  . Intractable vomiting with nausea   . Personal history of colonic polyps   . Benign neoplasm of transverse colon   . Incisional hernia 07/23/2017  . Chest pain syndrome 04/04/2017  . Heart palpitations 04/04/2017    Past Surgical History:  Procedure Laterality Date  . ABDOMINAL HYSTERECTOMY    . CHOLECYSTECTOMY    . COLONOSCOPY WITH PROPOFOL N/A 07/26/2017   Procedure: COLONOSCOPY WITH PROPOFOL;  Surgeon: Lucilla Lame, MD;  Location: [place];  Service: Endoscopy;  Laterality: N/A;  . ESOPHAGOGASTRODUODENOSCOPY (EGD) WITH PROPOFOL N/A 07/26/2017   Procedure: ESOPHAGOGASTRODUODENOSCOPY (EGD) WITH PROPOFOL;  Surgeon: Lucilla Lame, MD;  Location: [place];  Service: Endoscopy;  Laterality: N/A;  . GASTRIC BYPASS    . OOPHORECTOMY    . POLYPECTOMY N/A 07/26/2017   Procedure: POLYPECTOMY;   Surgeon: Lucilla Lame, MD;  Location: [place];  Service: Endoscopy;  Laterality: N/A;  . THYROID SURGERY      OB History   No obstetric history on file.      Home Medications    Prior to Admission medications   Medication Sig Start Date End Date Taking? Authorizing Provider  aspirin EC 81 MG tablet Take by mouth.   Yes [provider]  atorvastatin (LIPITOR) 20 MG tablet Take by mouth.   Yes [provider]  Biotin (SUPER BIOTIN) 5 MG TABS Take by mouth.   Yes [provider]  buPROPion (WELLBUTRIN) 75 MG tablet Take 75 mg by mouth daily.   Yes [provider]  Cholecalciferol (VITAMIN D3) 1000 units CAPS Take by mouth.   Yes [provider]  cyanocobalamin (,VITAMIN B-12,) 1000 MCG/ML injection Inject as directed every thirty (30) days.   Yes [provider]  eletriptan (RELPAX) 40 MG tablet Take by mouth.   Yes [provider]  EPINEPHrine 0.3 mg/0.3 mL IJ SOAJ injection Inject into the skin.   Yes [provider]  escitalopram (LEXAPRO) 10 MG tablet Take 10 mg by mouth daily.   Yes [provider]  esomeprazole (NEXIUM) 40 MG capsule Take 40 mg by mouth daily at 12 noon.   Yes [provider]  ibuprofen (ADVIL,MOTRIN) 200 MG tablet Take by mouth.   Yes  [provider]  Lactobacillus Rhamnosus, GG, (CULTURELLE) CAPS Take by mouth.   Yes [provider]  linaclotide (LINZESS) 290 MCG CAPS capsule Take 290 mcg by mouth daily before breakfast.   Yes [provider]  loratadine (CLARITIN) 10 MG tablet Take 10 mg by mouth daily.   Yes [provider]  LORazepam (ATIVAN) 0.5 MG tablet lorazepam 0.5 mg tablet  TK 1 T PO TID PRN   Yes [provider]  metFORMIN (GLUCOPHAGE) 500 MG tablet Take 500 mg by mouth 2 (two) times daily with a meal.   Yes [provider]  metoprolol tartrate (LOPRESSOR) 50 MG tablet metoprolol tartrate 50 mg  tablet  Take 1 tablet twice a day by oral route.   Yes [provider]  Multiple Vitamin (MULTIVITAMIN) capsule Take by mouth.   Yes [provider]  ondansetron (ZOFRAN) 4 MG tablet Take by mouth.   Yes [provider]  simvastatin (ZOCOR) 10 MG tablet Take by mouth.   Yes [provider]  traZODone (DESYREL) 50 MG tablet Take by mouth.   Yes [provider]  isosorbide mononitrate (IMDUR) 30 MG 24 hr tablet Take 1 tablet (30 mg total) by mouth daily. 03/31/17 06/08/19 Yes Conty, Linward Foster, MD  gabapentin (NEURONTIN) 300 MG capsule 300 mg nightly. May increase to 300 mg three times daily 06/08/19   Coral Spikes, DO  nitroGLYCERIN (NITROSTAT) 0.4 MG SL tablet Place 1 tablet (0.4 mg total) under the tongue every 5 (five) minutes as needed for chest pain. Patient not taking: Reported on 07/26/2017 03/31/17 06/08/19  Norval Gable, MD  omeprazole (PRILOSEC) 20 MG capsule Take by mouth.  06/08/19  [provider]    Family History Family History  Problem Relation Age of Onset  . Diabetes Father   . Heart attack Father   . Breast cancer Neg Hx     Social History Social History   Tobacco Use  . Smoking status: Never Smoker  . Smokeless tobacco: Never Used  Substance Use Topics  . Alcohol use: No  . Drug use: No     Allergies   Aluminum, Cheese flavor, Codeine, Milk-related compounds, Molds & smuts, Oxycodone-acetaminophen, Penicillins, Sulfa antibiotics, Gluten meal, Latex, and Meperidine   Review of Systems Review of Systems  Cardiovascular: Negative for leg swelling.  Musculoskeletal:       Leg pain.   Physical Exam Triage Vital Signs ED Triage Vitals  Enc Vitals Group     BP 06/08/19 1425 122/65     Pulse Rate 06/08/19 1425 82     Resp 06/08/19 1425 18     Temp 06/08/19 1425 98 F (36.7 C)     Temp Source 06/08/19 1425 Oral     SpO2 06/08/19 1425 99 %     Weight 06/08/19 1426 148 lb (67.1 kg)     Height 06/08/19 1426 5'  1.5" (1.562 m)     Head Circumference --      Peak Flow --      Pain Score 06/08/19 1425 7     Pain Loc --      Pain Edu? --      Excl. in [place]? --    Updated Vital Signs BP 122/65 (BP Location: Left Arm)   Pulse 82   Temp 98 F (36.7 C) (Oral)   Resp 18   Ht 5' 1.5" (1.562 m)   Wt 67.1 kg   SpO2 99%   BMI 27.51 kg/m  Visual Acuity Right Eye Distance:   Left Eye Distance:   Bilateral Distance:    Right Eye Near:   Left Eye Near:    Bilateral Near:     Physical Exam Vitals and nursing note reviewed.  Constitutional:      General: She is not in acute distress.    Appearance: Normal appearance. She is not ill-appearing.  HENT:     Head: Normocephalic and atraumatic.  Eyes:     General:        Right eye: No discharge.        Left eye: No discharge.     Conjunctiva/sclera: Conjunctivae normal.  Cardiovascular:     Rate and Rhythm: Normal rate and regular rhythm.     Heart sounds: No murmur.  Pulmonary:     Effort: Pulmonary effort is normal.     Breath sounds: No wheezing, rhonchi or rales.  Musculoskeletal:     Comments: No appreciable abnormalities of the left lower leg.  No calf tenderness.  No swelling.  Neurological:     Mental Status: She is alert.  Psychiatric:        Mood and Affect: Mood normal.        Behavior: Behavior normal.     UC Treatments / Results  Labs (all labs ordered are listed, but only abnormal results are displayed) Labs Reviewed - No data to display  EKG   Radiology DG Tibia/Fibula Left  Result Date: 06/08/2019 CLINICAL DATA:  Left lower leg pain for 2 weeks. EXAM: LEFT TIBIA AND FIBULA - 2 VIEW COMPARISON:  None. FINDINGS: Cortical margins of the tibia and fibula are intact. There is no evidence of fracture or other focal bone lesions. Bones appear under mineralized. Mild osteoarthritis of the knee. Soft tissues are unremarkable. IMPRESSION: No acute findings or explanation for lower leg pain. Electronically Signed   By: Keith Rake M.D.   On: 06/08/2019 15:12    Procedures Procedures (including critical care time)  Medications Ordered in UC Medications - No data to display  Initial Impression / Assessment and Plan / UC Course  I have reviewed the triage vital signs and the nursing notes.  Pertinent labs & imaging results that were available during my care of the patient were reviewed by me and considered in my medical decision making (see chart for details).    69 year old female presents with pain of her left lower extremity.  X-ray obtained and was reviewed independently by me.  Interpretation: No acute abnormalities.  Normal x-ray.  Etiology and prognosis of her pain is unclear at this time.  She describes neuropathic pain.  There is no evidence of DVT.  Placing on gabapentin.  Advise follow-up with primary care physician.  Final Clinical Impressions(s) / UC Diagnoses   Final diagnoses:  Left leg pain     Discharge Instructions     Medication as directed.  Tylenol as needed.  See Princella Ion.  Take care  Dr. Lacinda Axon     ED Prescriptions    Medication Sig Dispense Auth. Provider   gabapentin (NEURONTIN) 300 MG capsule 300 mg nightly. May increase to 300 mg three times daily 90 capsule Thersa Salt G, DO     PDMP not reviewed this encounter.   Coral Spikes, [place] 06/08/19 2246

## 2019-06-10 ENCOUNTER — Telehealth: Payer: Self-pay | Admitting: Family Medicine

## 2019-06-10 MED ORDER — GABAPENTIN 300 MG PO CAPS: 300.0000 mg | ORAL_CAPSULE | Freq: Three times a day (TID) | ORAL | 0 refills | Status: AC

## 2019-06-10 NOTE — Telephone Encounter (Signed)
Pt called and states the rx for Gabapentin is $86 at [facility]. Pt would like rx sent to [place] instead.   Please advise. Thanks!

## 2019-08-18 ENCOUNTER — Other Ambulatory Visit: Payer: Self-pay | Admitting: Internal Medicine

## 2019-08-18 DIAGNOSIS — Z1231 Encounter for screening mammogram for malignant neoplasm of breast: Secondary | ICD-10-CM

## 2019-08-18 DIAGNOSIS — M5416 Radiculopathy, lumbar region: Secondary | ICD-10-CM

## 2019-10-21 ENCOUNTER — Ambulatory Visit
Admission: RE | Admit: 2019-10-21 | Discharge: 2019-10-21 | Disposition: A | Discharge status: Home / Self Care | Payer: Medicare Other | Source: Ambulatory Visit | Attending: Internal Medicine | Admitting: Internal Medicine

## 2019-10-21 ENCOUNTER — Other Ambulatory Visit: Payer: Self-pay

## 2019-10-21 DIAGNOSIS — M5416 Radiculopathy, lumbar region: Secondary | ICD-10-CM | POA: Diagnosis present

## 2022-04-20 ENCOUNTER — Ambulatory Visit
Admission: EM | Admit: 2022-04-20 | Discharge: 2022-04-20 | Disposition: A | Discharge status: Home / Self Care | Payer: Medicare PPO | Attending: Emergency Medicine | Admitting: Emergency Medicine

## 2022-04-20 ENCOUNTER — Ambulatory Visit: Payer: Medicare PPO

## 2022-04-20 ENCOUNTER — Ambulatory Visit (INDEPENDENT_AMBULATORY_CARE_PROVIDER_SITE_OTHER): Payer: Medicare PPO

## 2022-04-20 DIAGNOSIS — S42215A Unspecified nondisplaced fracture of surgical neck of left humerus, initial encounter for closed fracture: Secondary | ICD-10-CM | POA: Diagnosis not present

## 2022-04-20 NOTE — ED Triage Notes (Signed)
Pt presents to UC for fall x45 minutes ago DOI: 04/20/22 pt states she was walking into den with her plate of food and fell forward. Pt c/o pain from LT wrist into LT elbow and LT shoulder pain, pt denies any LOC

## 2022-04-20 NOTE — Discharge Instructions (Addendum)
As we discussed, you broke your humerus at the shoulder.  I want you to wear the sling to take the weight off your arm and allow your shoulder to heal.  You can take the gabapentin that you have been previously prescribed up to 3 times a day as needed for pain.  You may supplement this with over-the-counter Tylenol up to 1000 mg every 6 hours.  Call [facility] clinic orthopedics tomorrow and make an appointment to arrange follow-up for your humeral neck fracture.  If you have any sharp increase in pain, swelling of your arm, or develop numbness or tingling of your fingers please go to the ER for evaluation.

## 2022-04-20 NOTE — ED Provider Notes (Signed)
[facility]    CSN: [id number] Arrival date & time: 04/20/22  1803      History   Chief Complaint Chief Complaint  Patient presents with   Fall   Shoulder Pain    LT   Arm Pain    LT    HPI Lisa Livingston is a 72 y.o. female.   HPI  72 year old female here for evaluation of left arm pain.  Patient has a significant past medical history to include thyroid disease, IBS, GERD, hypertension, diabetes, depression, and anemia presenting for evaluation of pain in her left shoulder, left elbow, and left wrist after suffering a ground-level fall just prior to arrival.  She states that she was walking into her den with a plate of food and she tripped and fell onto her left side.  She states that her left cheek did grazed the carpet but she did not strike her head and she denies a loss of consciousness.  She has no headache, change in vision, and she denies numbness or tingling in her fingers.  She did take 300 mg of gabapentin for pain in her left arm.  She is holding it flexed at the elbow at 90 degrees across her body for comfort.  Past Medical History:  Diagnosis Date   Anemia    Depression    Diabetes mellitus without complication (H[facility])    GERD (gastroesophageal reflux disease)    Headache    migraines   Hypertension    IBS (irritable bowel syndrome)    Thyroid disease     Patient Active Problem List   Diagnosis Date Noted   Intractable vomiting with nausea    Personal history of colonic polyps    Benign neoplasm of transverse colon    Incisional hernia 07/23/2017   Chest pain syndrome 04/04/2017   Heart palpitations 04/04/2017    Past Surgical History:  Procedure Laterality Date   ABDOMINAL HYSTERECTOMY     CHOLECYSTECTOMY     COLONOSCOPY WITH PROPOFOL N/A 07/26/2017   Procedure: COLONOSCOPY WITH PROPOFOL;  Surgeon: Lucilla Lame, MD;  Location: [place];  Service: Endoscopy;  Laterality: N/A;   ESOPHAGOGASTRODUODENOSCOPY (EGD) WITH  PROPOFOL N/A 07/26/2017   Procedure: ESOPHAGOGASTRODUODENOSCOPY (EGD) WITH PROPOFOL;  Surgeon: Lucilla Lame, MD;  Location: [place];  Service: Endoscopy;  Laterality: N/A;   GASTRIC BYPASS     OOPHORECTOMY     POLYPECTOMY N/A 07/26/2017   Procedure: POLYPECTOMY;  Surgeon: Lucilla Lame, MD;  Location: [place];  Service: Endoscopy;  Laterality: N/A;   THYROID SURGERY      OB History   No obstetric history on file.      Home Medications    Prior to Admission medications   Medication Sig Start Date End Date Taking? Authorizing Provider  aspirin EC 81 MG tablet Take by mouth.    [provider]  atorvastatin (LIPITOR) 20 MG tablet Take by mouth.    [provider]  Biotin (SUPER BIOTIN) 5 MG TABS Take by mouth.    [provider]  buPROPion (WELLBUTRIN) 75 MG tablet Take 75 mg by mouth daily.    [provider]  Cholecalciferol (VITAMIN D3) 1000 units CAPS Take by mouth.    [provider]  cyanocobalamin (,VITAMIN B-12,) 1000 MCG/ML injection Inject as directed every thirty (30) days.    [provider]  eletriptan (RELPAX) 40 MG tablet Take by mouth.    [provider]  EPINEPHrine 0.3 mg/0.3 mL  IJ SOAJ injection Inject into the skin.    [provider]  escitalopram (LEXAPRO) 10 MG tablet Take 10 mg by mouth daily.    [provider]  esomeprazole (NEXIUM) 40 MG capsule Take 40 mg by mouth daily at 12 noon.    [provider]  gabapentin (NEURONTIN) 300 MG capsule 300 mg nightly. May increase to 300 mg three times daily 06/08/19   Coral Spikes, DO  gabapentin (NEURONTIN) 300 MG capsule Take 1 capsule (300 mg total) by mouth 3 (three) times daily. 06/10/19   Coral Spikes, DO  ibuprofen (ADVIL,MOTRIN) 200 MG tablet Take by mouth.    [provider]  Lactobacillus Rhamnosus, GG, (CULTURELLE) CAPS Take by mouth.    [provider]  linaclotide (LINZESS) 290 MCG  CAPS capsule Take 290 mcg by mouth daily before breakfast.    [provider]  loratadine (CLARITIN) 10 MG tablet Take 10 mg by mouth daily.    [provider]  LORazepam (ATIVAN) 0.5 MG tablet lorazepam 0.5 mg tablet  TK 1 T PO TID PRN    [provider]  metFORMIN (GLUCOPHAGE) 500 MG tablet Take 500 mg by mouth 2 (two) times daily with a meal.    [provider]  metoprolol tartrate (LOPRESSOR) 50 MG tablet metoprolol tartrate 50 mg tablet  Take 1 tablet twice a day by oral route.    [provider]  Multiple Vitamin (MULTIVITAMIN) capsule Take by mouth.    [provider]  ondansetron (ZOFRAN) 4 MG tablet Take by mouth.    [provider]  simvastatin (ZOCOR) 10 MG tablet Take by mouth.    [provider]  traZODone (DESYREL) 50 MG tablet Take by mouth.    [provider]  isosorbide mononitrate (IMDUR) 30 MG 24 hr tablet Take 1 tablet (30 mg total) by mouth daily. 03/31/17 06/08/19  Norval Gable, MD  nitroGLYCERIN (NITROSTAT) 0.4 MG SL tablet Place 1 tablet (0.4 mg total) under the tongue every 5 (five) minutes as needed for chest pain. Patient not taking: Reported on 07/26/2017 03/31/17 06/08/19  Norval Gable, MD  omeprazole (PRILOSEC) 20 MG capsule Take by mouth.  06/08/19  [provider]    Family History Family History  Problem Relation Age of Onset   Diabetes Father    Heart attack Father    Breast cancer Neg Hx     Social History Social History   Tobacco Use   Smoking status: Never   Smokeless tobacco: Never  Vaping Use   Vaping Use: Never used  Substance Use Topics   Alcohol use: No   Drug use: No     Allergies   Aluminum, Cheese flavor, Codeine, Milk-related compounds, Molds & smuts, Oxycodone-acetaminophen, Penicillins, Sulfa antibiotics, Gluten meal, Latex, and Meperidine   Review of Systems Review of Systems  Musculoskeletal:  Positive for arthralgias. Negative for joint  swelling.  Skin:  Negative for color change.  Neurological:  Negative for weakness and numbness.     Physical Exam Triage Vital Signs ED Triage Vitals [04/20/22 1851]  Enc Vitals Group     BP (!) 152/80     Pulse Rate 67     Resp      Temp 98.1 F (36.7 C)     Temp Source Oral     SpO2 100 %     Weight 145 lb (65.8 kg)     Height 5' 1.5" (1.562 m)     Head Circumference  Peak Flow      Pain Score 8     Pain Loc      Pain Edu?      Excl. in [place]?    No data found.  Updated Vital Signs BP (!) 152/80 (BP Location: Right Arm)   Pulse 67   Temp 98.1 F (36.7 C) (Oral)   Ht 5' 1.5" (1.562 m)   Wt 145 lb (65.8 kg)   SpO2 100%   BMI 26.95 kg/m   Visual Acuity Right Eye Distance:   Left Eye Distance:   Bilateral Distance:    Right Eye Near:   Left Eye Near:    Bilateral Near:     Physical Exam Vitals and nursing note reviewed.  Constitutional:      Appearance: Normal appearance.  Musculoskeletal:        General: Tenderness and signs of injury present. No swelling or deformity.  Skin:    General: Skin is warm and dry.     Capillary Refill: Capillary refill takes less than 2 seconds.     Findings: No bruising or erythema.  Neurological:     General: No focal deficit present.     Mental Status: She is alert and oriented to person, place, and time.      UC Treatments / Results  Labs (all labs ordered are listed, but only abnormal results are displayed) Labs Reviewed - No data to display  EKG   Radiology DG Wrist Complete Left  Result Date: 04/20/2022 CLINICAL DATA:  Pain after fall EXAM: LEFT WRIST - COMPLETE 4 VIEW COMPARISON:  None FINDINGS: Slight negative ulnar variance. Vascular calcifications. No fracture or dislocation. Preserved joint spaces. Severe osteopenia. With this level of osteopenia sternal nondisplaced injury is difficult to completely exclude and if needed treatment with follow-up imaging in 7-10 days to assess for occult abnormality.  IMPRESSION: Negative ulnar variance. No acute osseous abnormality severe osteopenia. Vascular calcifications. Electronically Signed   By: Jill Side M.D.   On: 04/20/2022 19:39   DG Elbow Complete Left  Result Date: 04/20/2022 CLINICAL DATA:  Fall EXAM: LEFT ELBOW - COMPLETE 3+ VIEW COMPARISON:  None Available. FINDINGS: Bones are osteopenic. There is no evidence of fracture, dislocation, or joint effusion. There is no evidence of arthropathy or other focal bone abnormality. Soft tissues are unremarkable. IMPRESSION: Negative. Electronically Signed   By: Ronney Asters M.D.   On: 04/20/2022 19:39   DG Shoulder Left  Result Date: 04/20/2022 CLINICAL DATA:  Fall EXAM: LEFT SHOULDER - 2+ VIEW COMPARISON:  None. FINDINGS: Bones are osteopenic. There is a comminuted fracture of the left humeral neck and greater tuberosity. Greater tuberosity is displaced laterally 7 mm. Neck fracture is in near anatomic alignment. There is no dislocation. There is soft tissue swelling surrounding the fracture. IMPRESSION: Comminuted fracture of the left humeral neck and greater tuberosity. Electronically Signed   By: Ronney Asters M.D.   On: 04/20/2022 19:38    Procedures Procedures (including critical care time)  Medications Ordered in UC Medications - No data to display  Initial Impression / Assessment and Plan / UC Course  I have reviewed the triage vital signs and the nursing notes.  Pertinent labs & imaging results that were available during my care of the patient were reviewed by me and considered in my medical decision making (see chart for details).   Patient is a very pleasant, nontoxic-appearing 72 year old female who suffered a ground-level fall just prior to arrival and landed on her  left arm on a carpeted surface.  She is complaining of pain from her wrist to her shoulder.  She is holding her left arm with the elbow flexed at 90 degrees tight to her body.  She can supinate her wrist to 90 degrees but no  further.  She will not externally rotate her arm or extend her elbow.  Radial and ulnar pulses are 2+.  She has 5/5 grip strength in the left hand.  Her tenderness is with palpation of the proximal ulna and radius as well as palpation of the olecranon process.  Pain with compression of medial lateral condyle of the humerus and with palpation of the shaft of the humerus.  Also pain with palpation of the entirety of the glenohumeral joint and AC joint.  There is no visible bruising, edema, or erythema.  I will obtain radiographs of the left shoulder, left elbow, and left wrist to rule out any bony abnormality.  Radiology impression of right shoulder films states there is a comminuted fracture of the left humeral neck and greater tuberosity.  Radiology impression of left elbow states that the bones are osteopenic but there is no evidence of fracture, dislocation, or joint effusion.  Impression of left wrist states negative ulnar variance but no acute osseous abnormality.  Severe osteopenia.  Vascular calcifications.  I will order the patient to be put into a sling and discharged home with a diagnosis of left humeral fracture.  I will give her contact information for orthopedics at [facility] clinic for her to follow-up on her comminuted humeral neck fracture.  The patient reports that she is unable to take any prescription pain medication.  She does have gabapentin that has been previously prescribed and I will have her continue to take that up to 3 times a day as needed for pain.  She can supplement this with over-the-counter Tylenol.   Final Clinical Impressions(s) / UC Diagnoses   Final diagnoses:  Closed nondisplaced fracture of surgical neck of left humerus, unspecified fracture morphology, initial encounter     Discharge Instructions      As we discussed, you broke your humerus at the shoulder.  I want you to wear the sling to take the weight off your arm and allow your shoulder to  heal.  You can take the gabapentin that you have been previously prescribed up to 3 times a day as needed for pain.  You may supplement this with over-the-counter Tylenol up to 1000 mg every 6 hours.  Call [facility] clinic orthopedics tomorrow and make an appointment to arrange follow-up for your humeral neck fracture.  If you have any sharp increase in pain, swelling of your arm, or develop numbness or tingling of your fingers please go to the ER for evaluation.     ED Prescriptions   None    PDMP not reviewed this encounter.   Margarette Canada, NP 04/20/22 [phone]

## 2022-10-19 ENCOUNTER — Other Ambulatory Visit: Payer: Self-pay | Admitting: Family Medicine

## 2022-10-19 DIAGNOSIS — R221 Localized swelling, mass and lump, neck: Secondary | ICD-10-CM

## 2022-10-30 ENCOUNTER — Ambulatory Visit
Admission: RE | Admit: 2022-10-30 | Discharge: 2022-10-30 | Disposition: A | Discharge status: Home / Self Care | Payer: Medicare HMO | Source: Ambulatory Visit | Attending: Family Medicine | Admitting: Family Medicine

## 2022-10-30 DIAGNOSIS — R221 Localized swelling, mass and lump, neck: Secondary | ICD-10-CM | POA: Insufficient documentation

## 2022-11-28 ENCOUNTER — Telehealth: Payer: Self-pay

## 2022-11-28 NOTE — Telephone Encounter (Signed)
Patient called in to schedule a EGD and colonoscopy. I informed her she will need another referral to be sent to our practice.

## 2022-11-30 NOTE — Telephone Encounter (Signed)
Patient called in to schedule her procedure and I advised her we still need a referral to be sent in. The patient stated that her doctor is going to fax it over this week. I informed her once we get it then we will call her to schedule but she will have to have an office visit because she is also having EGD with the colonoscopy.

## 2023-01-23 ENCOUNTER — Other Ambulatory Visit: Payer: Self-pay | Admitting: Family Medicine

## 2023-01-23 DIAGNOSIS — Z1231 Encounter for screening mammogram for malignant neoplasm of breast: Secondary | ICD-10-CM

## 2023-05-15 ENCOUNTER — Inpatient Hospital Stay
Admission: RE | Admit: 2023-05-15 | Discharge: 2023-05-15 | Disposition: A | Discharge status: Home / Self Care | Payer: Self-pay | Source: Ambulatory Visit | Attending: Family Medicine | Admitting: Family Medicine

## 2023-05-15 ENCOUNTER — Ambulatory Visit
Admission: RE | Admit: 2023-05-15 | Discharge: 2023-05-15 | Disposition: A | Discharge status: Home / Self Care | Source: Ambulatory Visit | Attending: Family Medicine | Admitting: Family Medicine

## 2023-05-15 ENCOUNTER — Other Ambulatory Visit: Payer: Self-pay | Admitting: *Deleted

## 2023-05-15 DIAGNOSIS — Z1231 Encounter for screening mammogram for malignant neoplasm of breast: Secondary | ICD-10-CM

## 2023-05-22 ENCOUNTER — Ambulatory Visit: Admitting: Anesthesiology

## 2023-05-22 ENCOUNTER — Encounter
Admission: RE | Disposition: A | Discharge status: Home / Self Care | Payer: Self-pay | Source: Home / Self Care | Attending: Gastroenterology

## 2023-05-22 ENCOUNTER — Ambulatory Visit
Admission: RE | Admit: 2023-05-22 | Discharge: 2023-05-22 | Disposition: A | Discharge status: Home / Self Care | Attending: Gastroenterology | Admitting: Gastroenterology

## 2023-05-22 ENCOUNTER — Encounter: Payer: Self-pay | Admitting: *Deleted

## 2023-05-22 ENCOUNTER — Other Ambulatory Visit: Payer: Self-pay

## 2023-05-22 DIAGNOSIS — E119 Type 2 diabetes mellitus without complications: Secondary | ICD-10-CM | POA: Insufficient documentation

## 2023-05-22 DIAGNOSIS — Z980 Intestinal bypass and anastomosis status: Secondary | ICD-10-CM | POA: Diagnosis not present

## 2023-05-22 DIAGNOSIS — Z9884 Bariatric surgery status: Secondary | ICD-10-CM | POA: Diagnosis not present

## 2023-05-22 DIAGNOSIS — Z7984 Long term (current) use of oral hypoglycemic drugs: Secondary | ICD-10-CM | POA: Diagnosis not present

## 2023-05-22 DIAGNOSIS — R1310 Dysphagia, unspecified: Secondary | ICD-10-CM | POA: Insufficient documentation

## 2023-05-22 DIAGNOSIS — Z1211 Encounter for screening for malignant neoplasm of colon: Secondary | ICD-10-CM | POA: Diagnosis present

## 2023-05-22 DIAGNOSIS — I10 Essential (primary) hypertension: Secondary | ICD-10-CM | POA: Diagnosis not present

## 2023-05-22 DIAGNOSIS — K589 Irritable bowel syndrome without diarrhea: Secondary | ICD-10-CM | POA: Diagnosis not present

## 2023-05-22 DIAGNOSIS — D123 Benign neoplasm of transverse colon: Secondary | ICD-10-CM | POA: Diagnosis not present

## 2023-05-22 DIAGNOSIS — F32A Depression, unspecified: Secondary | ICD-10-CM | POA: Insufficient documentation

## 2023-05-22 DIAGNOSIS — K219 Gastro-esophageal reflux disease without esophagitis: Secondary | ICD-10-CM | POA: Insufficient documentation

## 2023-05-22 DIAGNOSIS — K5730 Diverticulosis of large intestine without perforation or abscess without bleeding: Secondary | ICD-10-CM | POA: Diagnosis not present

## 2023-05-22 DIAGNOSIS — K222 Esophageal obstruction: Secondary | ICD-10-CM | POA: Insufficient documentation

## 2023-05-22 DIAGNOSIS — D649 Anemia, unspecified: Secondary | ICD-10-CM | POA: Insufficient documentation

## 2023-05-22 DIAGNOSIS — R519 Headache, unspecified: Secondary | ICD-10-CM | POA: Diagnosis not present

## 2023-05-22 DIAGNOSIS — Z79899 Other long term (current) drug therapy: Secondary | ICD-10-CM | POA: Insufficient documentation

## 2023-05-22 DIAGNOSIS — K640 First degree hemorrhoids: Secondary | ICD-10-CM | POA: Diagnosis not present

## 2023-05-22 HISTORY — PX: ESOPHAGOGASTRODUODENOSCOPY: SHX5428

## 2023-05-22 HISTORY — PX: POLYPECTOMY: SHX5525

## 2023-05-22 HISTORY — PX: COLONOSCOPY: SHX5424

## 2023-05-22 LAB — GLUCOSE, CAPILLARY: Glucose-Capillary: 138 mg/dL — ABNORMAL HIGH (ref 70–99)

## 2023-05-22 SURGERY — COLONOSCOPY
Anesthesia: General

## 2023-05-22 MED ORDER — GLYCOPYRROLATE 0.2 MG/ML IJ SOLN
INTRAMUSCULAR | Status: AC
  Filled 2023-05-22: qty 1

## 2023-05-22 MED ORDER — LIDOCAINE HCL (PF) 2 % IJ SOLN
INTRAMUSCULAR | Status: AC
  Filled 2023-05-22: qty 5

## 2023-05-22 MED ORDER — SODIUM CHLORIDE 0.9 % IV SOLN: INTRAVENOUS | Status: DC

## 2023-05-22 MED ORDER — PROPOFOL 10 MG/ML IV BOLUS
INTRAVENOUS | Status: DC | PRN
  Administered 2023-05-22: 30 mg via INTRAVENOUS
  Administered 2023-05-22: 50 mg via INTRAVENOUS

## 2023-05-22 MED ORDER — LIDOCAINE HCL (CARDIAC) PF 100 MG/5ML IV SOSY
PREFILLED_SYRINGE | INTRAVENOUS | Status: DC | PRN
  Administered 2023-05-22: 60 mg via INTRAVENOUS

## 2023-05-22 MED ORDER — PROPOFOL 500 MG/50ML IV EMUL
INTRAVENOUS | Status: DC | PRN
  Administered 2023-05-22: 75 ug/kg/min via INTRAVENOUS

## 2023-05-22 MED ORDER — EPHEDRINE 5 MG/ML INJ
INTRAVENOUS | Status: AC
  Filled 2023-05-22: qty 5

## 2023-05-22 MED ORDER — DEXMEDETOMIDINE HCL IN NACL 80 MCG/20ML IV SOLN
INTRAVENOUS | Status: DC | PRN
  Administered 2023-05-22: 20 ug via INTRAVENOUS

## 2023-05-22 NOTE — Interval H&P Note (Signed)
 History and Physical Interval Note:  05/22/2023 10:05 AM  Lisa Livingston  has presented today for surgery, with the diagnosis of GERD Dysphagia h/o TA polyp.  The various methods of treatment have been discussed with the patient and family. After consideration of risks, benefits and other options for treatment, the patient has consented to  Procedure(s) with comments: COLONOSCOPY (N/A) - DM EGD (ESOPHAGOGASTRODUODENOSCOPY) (N/A) as a surgical intervention.  The patient's history has been reviewed, patient examined, no change in status, stable for surgery.  I have reviewed the patient's chart and labs.  Questions were answered to the patient's satisfaction.     Regis Bill  Ok to proceed with EGD/Colonoscopy

## 2023-05-22 NOTE — H&P (Signed)
 Outpatient short stay form Pre-procedure 05/22/2023  Lisa Bill, MD  Primary Physician: Resa Miner, MD  Reason for visit:  Dysphagia/History of polyps  History of present illness:    73 y/o lady with history of roux-en-y, IBS, and depression here for EGD for solid food dysphagia and colonoscopy for history of polyps. No blood thinners. No family history of GI malignancies. History of roux-en-y and cholecystectomy.    Current Facility-Administered Medications:    0.9 %  sodium chloride infusion, , Intravenous, Continuous, Khizar Fiorella, Rossie Muskrat, MD, Last Rate: 20 mL/hr at 05/22/23 0941, New Bag at 05/22/23 0941  Medications Prior to Admission  Medication Sig Dispense Refill Last Dose/Taking   EPINEPHrine 0.3 mg/0.3 mL IJ SOAJ injection Inject into the skin.   Past Month   gabapentin (NEURONTIN) 300 MG capsule Take 1 capsule (300 mg total) by mouth 3 (three) times daily. 90 capsule 0 Past Month   aspirin EC 81 MG tablet Take by mouth.   05/14/2023   atorvastatin (LIPITOR) 20 MG tablet Take by mouth.   05/20/2023   Biotin (SUPER BIOTIN) 5 MG TABS Take by mouth.   05/20/2023   buPROPion (WELLBUTRIN) 75 MG tablet Take 75 mg by mouth daily.   05/20/2023   Cholecalciferol (VITAMIN D3) 1000 units CAPS Take by mouth.   05/20/2023   cyanocobalamin (,VITAMIN B-12,) 1000 MCG/ML injection Inject as directed every thirty (30) days.   05/20/2023   eletriptan (RELPAX) 40 MG tablet Take by mouth.      escitalopram (LEXAPRO) 10 MG tablet Take 10 mg by mouth daily.   05/20/2023   esomeprazole (NEXIUM) 40 MG capsule Take 40 mg by mouth daily at 12 noon.   05/20/2023   gabapentin (NEURONTIN) 300 MG capsule 300 mg nightly. May increase to 300 mg three times daily 90 capsule 0    ibuprofen (ADVIL,MOTRIN) 200 MG tablet Take by mouth.      Lactobacillus Rhamnosus, GG, (CULTURELLE) CAPS Take by mouth.      linaclotide (LINZESS) 290 MCG CAPS capsule Take 290 mcg by mouth daily before breakfast.       loratadine (CLARITIN) 10 MG tablet Take 10 mg by mouth daily.   05/20/2023   LORazepam (ATIVAN) 0.5 MG tablet lorazepam 0.5 mg tablet  TK 1 T PO TID PRN      metFORMIN (GLUCOPHAGE) 500 MG tablet Take 500 mg by mouth 2 (two) times daily with a meal.   05/20/2023   metoprolol tartrate (LOPRESSOR) 50 MG tablet metoprolol tartrate 50 mg tablet  Take 1 tablet twice a day by oral route.   05/20/2023   Multiple Vitamin (MULTIVITAMIN) capsule Take by mouth.   05/20/2023   ondansetron (ZOFRAN) 4 MG tablet Take by mouth.      simvastatin (ZOCOR) 10 MG tablet Take by mouth.   05/20/2023   traZODone (DESYREL) 50 MG tablet Take by mouth.        Allergies  Allergen Reactions   Aluminum Anaphylaxis    Swelling of throat, lips, tongue Swelling of throat, lips, tongue    Cheese Flavoring Agent (Non-Screening) Anaphylaxis    Throat swelling, lips, tongue Throat swelling, lips, tongue    Codeine Palpitations   Milk-Related Compounds Anaphylaxis, Diarrhea and Nausea And Vomiting    GI upset GI upset    Molds & Smuts Anaphylaxis    Swelling of lips, throat Swelling of lips, throat    Oxycodone-Acetaminophen Shortness Of Breath    Other reaction(s): Other (See Comments) Chest pressure severe  Penicillins Hives   Sulfa Antibiotics Hives and Rash   Gluten Meal Nausea Only    Other reaction(s): Other (See Comments) GI upset GI upset    Latex Rash    Other reaction(s): Other (See Comments) Redness at site of bandaids Redness at site of bandaids    Meperidine Nausea And Vomiting     Past Medical History:  Diagnosis Date   Anemia    Depression    Diabetes mellitus without complication (HCC)    GERD (gastroesophageal reflux disease)    Headache    migraines   Hypertension    IBS (irritable bowel syndrome)    Thyroid disease     Review of systems:  Otherwise negative.    Physical Exam  Gen: Alert, oriented. Appears stated age.  HEENT: PERRLA. Lungs: No respiratory distress CV:  RRR Abd: soft, benign, no masses Ext: No edema    Planned procedures: Proceed with EGD/colonoscopy. The patient understands the nature of the planned procedure, indications, risks, alternatives and potential complications including but not limited to bleeding, infection, perforation, damage to internal organs and possible oversedation/side effects from anesthesia. The patient agrees and gives consent to proceed.  Please refer to procedure notes for findings, recommendations and patient disposition/instructions.     Lisa Bill, MD [facility]

## 2023-05-22 NOTE — Op Note (Signed)
 [facility] Patient Name: Lisa Livingston Procedure Date: 05/22/2023 10:05 AM MRN: [id number] Account #: [id number] Date of Birth: 06-Sep-1950 Admit Type: Outpatient Age: 73 Room: [facility] ENDO ROOM 1 Gender: Female Note Status: Finalized Instrument Name: Prentice Docker [id number] Procedure:             Colonoscopy Indications:           Surveillance: Personal history of adenomatous polyps                         on last colonoscopy > 5 years ago Providers:             Eather Colas MD, MD Referring MD:          Bonnita Nasuti j. [facility], dr (Referring MD) Medicines:             Monitored Anesthesia Care Complications:         No immediate complications. Estimated blood loss:                         Minimal. Procedure:             Pre-Anesthesia Assessment:                        - Prior to the procedure, a History and Physical was                         performed, and patient medications and allergies were                         reviewed. The patient is competent. The risks and                         benefits of the procedure and the sedation options and                         risks were discussed with the patient. All questions                         were answered and informed consent was obtained.                         Patient identification and proposed procedure were                         verified by the physician, the nurse, the                         anesthesiologist, the anesthetist and the technician                         in the endoscopy suite. Mental Status Examination:                         alert and oriented. Airway Examination: normal                         oropharyngeal airway and neck mobility. Respiratory  Examination: clear to auscultation. CV Examination:                         normal. Prophylactic Antibiotics: The patient does not                         require prophylactic antibiotics.  Prior                         Anticoagulants: The patient has taken no anticoagulant                         or antiplatelet agents. ASA Grade Assessment: II - A                         patient with mild systemic disease. After reviewing                         the risks and benefits, the patient was deemed in                         satisfactory condition to undergo the procedure. The                         anesthesia plan was to use monitored anesthesia care                         (MAC). Immediately prior to administration of                         medications, the patient was re-assessed for adequacy                         to receive sedatives. The heart rate, respiratory                         rate, oxygen saturations, blood pressure, adequacy of                         pulmonary ventilation, and response to care were                         monitored throughout the procedure. The physical                         status of the patient was re-assessed after the                         procedure.                        After obtaining informed consent, the colonoscope was                         passed under direct vision. Throughout the procedure,                         the patient's blood pressure, pulse, and oxygen  saturations were monitored continuously. The                         Colonoscope was introduced through the anus and                         advanced to the the terminal ileum, with                         identification of the appendiceal orifice and IC                         valve. The colonoscopy was somewhat difficult due to a                         redundant colon. Successful completion of the                         procedure was aided by applying abdominal pressure.                         The patient tolerated the procedure well. The quality                         of the bowel preparation was good. The ileocecal                          valve, appendiceal orifice, and rectum were                         photographed. Findings:      The perianal and digital rectal examinations were normal.      The terminal ileum appeared normal.      A 5 mm polyp was found in the proximal transverse colon. The polyp was       sessile. The polyp was removed with a cold snare. Resection and       retrieval were complete. Estimated blood loss was minimal.      A few small-mouthed diverticula were found in the sigmoid colon.      Internal hemorrhoids were found during retroflexion. The hemorrhoids       were Grade I (internal hemorrhoids that do not prolapse).      The exam was otherwise without abnormality on direct and retroflexion       views. Impression:            - The examined portion of the ileum was normal.                        - One 5 mm polyp in the proximal transverse colon,                         removed with a cold snare. Resected and retrieved.                        - Diverticulosis in the sigmoid colon.                        - Internal hemorrhoids.                        -  The examination was otherwise normal on direct and                         retroflexion views. Recommendation:        - Discharge patient to home.                        - Resume previous diet.                        - Continue present medications.                        - Await pathology results.                        - Repeat colonoscopy is not recommended due to current                         age (6 years or older) for surveillance.                        - Return to referring physician as previously                         scheduled. Procedure Code(s):     --- Professional ---                        [phone], Colonoscopy, flexible; with removal of                         tumor(s), polyp(s), or other lesion(s) by snare                         technique Diagnosis Code(s):     --- Professional ---                        Z86.010, Personal  history of colonic polyps                        K64.0, First degree hemorrhoids                        D12.3, Benign neoplasm of transverse colon (hepatic                         flexure or splenic flexure)                        K57.30, Diverticulosis of large intestine without                         perforation or abscess without bleeding CPT copyright 2022 American Medical Association. All rights reserved. The codes documented in this report are preliminary and upon coder review may  be revised to meet current compliance requirements. Eather Colas MD, MD 05/22/2023 10:48:23 AM Number of Addenda: 0 Note Initiated On: 05/22/2023 10:05 AM Estimated Blood Loss:  Estimated blood loss was minimal.      [facility]

## 2023-05-22 NOTE — Anesthesia Preprocedure Evaluation (Signed)
 Anesthesia Evaluation  Patient identified by MRN, date of birth, ID band Patient awake    Reviewed: Allergy & Precautions, NPO status , Patient's Chart, lab work & pertinent test results  History of Anesthesia Complications Negative for: history of anesthetic complications  Airway Mallampati: IV  TM Distance: >3 FB Neck ROM: Full    Dental  (+) Partial Upper, Dental Advidsory Given, Caps,    Pulmonary neg pulmonary ROS   Pulmonary exam normal breath sounds clear to auscultation       Cardiovascular Exercise Tolerance: Good hypertension, (-) angina (-) Past MI and (-) Cardiac Stents Normal cardiovascular exam(-) dysrhythmias (-) Valvular Problems/Murmurs Rhythm:Regular Rate:Normal  Palpitations (normal echo and Holter)   Neuro/Psych  Headaches, neg Seizures PSYCHIATRIC DISORDERS  Depression       GI/Hepatic Neg liver ROS,GERD  ,,IBS; gastric bypass 2012   Endo/Other  diabetes, Type 2    Renal/GU negative Renal ROS     Musculoskeletal   Abdominal   Peds  Hematology  (+) Blood dyscrasia, anemia   Anesthesia Other Findings Past Medical History: No date: Anemia No date: Depression No date: Diabetes mellitus without complication (HCC) No date: GERD (gastroesophageal reflux disease) No date: Headache     Comment:  migraines No date: Hypertension No date: IBS (irritable bowel syndrome) No date: Thyroid disease   Reproductive/Obstetrics                             Anesthesia Physical Anesthesia Plan  ASA: 2  Anesthesia Plan: General   Post-op Pain Management:    Induction: Intravenous  PONV Risk Score and Plan: 3 and Propofol infusion and TIVA  Airway Management Planned: Natural Airway and Nasal Cannula  Additional Equipment:   Intra-op Plan:   Post-operative Plan:   Informed Consent: I have reviewed the patients History and Physical, chart, labs and discussed the procedure  including the risks, benefits and alternatives for the proposed anesthesia with the patient or authorized representative who has indicated his/her understanding and acceptance.       Plan Discussed with: CRNA  Anesthesia Plan Comments:         Anesthesia Quick Evaluation

## 2023-05-22 NOTE — Anesthesia Postprocedure Evaluation (Signed)
 Anesthesia Post Note  Patient: Lisa Livingston  Procedure(s) Performed: COLONOSCOPY EGD (ESOPHAGOGASTRODUODENOSCOPY) Balloon dilation wire-guided POLYPECTOMY  Patient location during evaluation: Endoscopy Anesthesia Type: General Level of consciousness: awake and alert Pain management: pain level controlled Vital Signs Assessment: post-procedure vital signs reviewed and stable Respiratory status: spontaneous breathing, nonlabored ventilation, respiratory function stable and patient connected to nasal cannula oxygen Cardiovascular status: blood pressure returned to baseline and stable Postop Assessment: no apparent nausea or vomiting Anesthetic complications: no   No notable events documented.   Last Vitals:  Vitals:   05/22/23 1050 05/22/23 1100  BP: (!) 114/59 (!) 115/58  Pulse: 94 92  Resp: 15 (!) 21  Temp:    SpO2: 97% 98%    Last Pain:  Vitals:   05/22/23 1100  TempSrc:   PainSc: 0-No pain                 Lenard Simmer

## 2023-05-22 NOTE — Op Note (Signed)
 [facility]astroenterology Patient Name: Lisa Livingston Procedure Date: 05/22/2023 10:07 AM MRN: [id number] Account #: [id number] Date of Birth: [birth year] Admit Type: Outpatient Age: 73 Room: [facility] ENDO ROOM 1 Gender: Female Note Status: Finalized Instrument Name: Patton Salles Endoscope [id number] Procedure:             Upper GI endoscopy Indications:           Dysphagia Providers:             Eather Colas MD, MD Referring MD:          Bonnita Nasuti j. [facility], dr (Referring MD) Medicines:             Monitored Anesthesia Care Complications:         No immediate complications. Procedure:             Pre-Anesthesia Assessment:                        - Prior to the procedure, a History and Physical was                         performed, and patient medications and allergies were                         reviewed. The patient is competent. The risks and                         benefits of the procedure and the sedation options and                         risks were discussed with the patient. All questions                         were answered and informed consent was obtained.                         Patient identification and proposed procedure were                         verified by the physician, the nurse, the                         anesthesiologist, the anesthetist and the technician                         in the endoscopy suite. Mental Status Examination:                         alert and oriented. Airway Examination: normal                         oropharyngeal airway and neck mobility. Respiratory                         Examination: clear to auscultation. CV Examination:                         normal. Prophylactic Antibiotics: The patient does not  require prophylactic antibiotics. Prior                         Anticoagulants: The patient has taken no anticoagulant                         or antiplatelet agents. ASA Grade  Assessment: II - A                         patient with mild systemic disease. After reviewing                         the risks and benefits, the patient was deemed in                         satisfactory condition to undergo the procedure. The                         anesthesia plan was to use monitored anesthesia care                         (MAC). Immediately prior to administration of                         medications, the patient was re-assessed for adequacy                         to receive sedatives. The heart rate, respiratory                         rate, oxygen saturations, blood pressure, adequacy of                         pulmonary ventilation, and response to care were                         monitored throughout the procedure. The physical                         status of the patient was re-assessed after the                         procedure.                        After obtaining informed consent, the endoscope was                         passed under direct vision. Throughout the procedure,                         the patient's blood pressure, pulse, and oxygen                         saturations were monitored continuously. The Endoscope                         was introduced through the mouth, and advanced to the  second part of duodenum. The upper GI endoscopy was                         accomplished without difficulty. The patient tolerated                         the procedure well. Findings:      A non-obstructing Schatzki ring was found in the lower third of the       esophagus. A TTS dilator was passed through the scope. Dilation with a       15-16.5-18 mm balloon dilator was performed to 18 mm. The dilation site       was examined and showed no change. Estimated blood loss: none.      Evidence of a Roux-en-Y gastrojejunostomy was found. The gastrojejunal       anastomosis was characterized by healthy appearing mucosa. This was        traversed. The pouch-to-jejunum limb was characterized by healthy       appearing mucosa.      The examined jejunum was normal. Impression:            - Non-obstructing Schatzki ring. Dilated.                        - Roux-en-Y gastrojejunostomy with gastrojejunal                         anastomosis characterized by healthy appearing mucosa.                        - Normal examined jejunum.                        - No specimens collected. Recommendation:        - Discharge patient to home.                        - Resume previous diet.                        - Continue present medications.                        - Return to referring physician as previously                         scheduled. Procedure Code(s):     --- Professional ---                        [phone], Esophagogastroduodenoscopy, flexible,                         transoral; with transendoscopic balloon dilation of                         esophagus (less than 30 mm diameter) Diagnosis Code(s):     --- Professional ---                        K22.2, Esophageal obstruction  Z98.0, Intestinal bypass and anastomosis status                        R13.10, Dysphagia, unspecified CPT copyright 2022 American Medical Association. All rights reserved. The codes documented in this report are preliminary and upon coder review may  be revised to meet current compliance requirements. Eather Colas MD, MD 05/22/2023 10:44:17 AM Number of Addenda: 0 Note Initiated On: 05/22/2023 10:07 AM Estimated Blood Loss:  Estimated blood loss: none.      [facility]

## 2023-05-22 NOTE — Transfer of Care (Signed)
 Immediate Anesthesia Transfer of Care Note  Patient: Lisa Livingston  Procedure(s) Performed: COLONOSCOPY EGD (ESOPHAGOGASTRODUODENOSCOPY) Balloon dilation wire-guided POLYPECTOMY  Patient Location: PACU  Anesthesia Type:General  Level of Consciousness: sedated  Airway & Oxygen Therapy: Patient Spontanous Breathing  Post-op Assessment: Report given to RN and Post -op Vital signs reviewed and stable  Post vital signs: Reviewed and stable  Last Vitals:  Vitals Value Taken Time  BP    Temp    Pulse 79 05/22/23 1041  Resp 11 05/22/23 1041  SpO2 97 % 05/22/23 1041  Vitals shown include unfiled device data.  Last Pain:  Vitals:   05/22/23 0927  TempSrc: Temporal  PainSc: 0-No pain         Complications: No notable events documented.

## 2023-05-23 ENCOUNTER — Encounter: Payer: Self-pay | Admitting: Gastroenterology

## 2023-05-23 LAB — SURGICAL PATHOLOGY

## 2023-05-28 ENCOUNTER — Ambulatory Visit
Admission: RE | Admit: 2023-05-28 | Discharge: 2023-05-28 | Disposition: A | Discharge status: Home / Self Care | Source: Ambulatory Visit | Attending: Family Medicine | Admitting: Family Medicine

## 2023-05-28 ENCOUNTER — Other Ambulatory Visit: Payer: Self-pay | Admitting: Family Medicine

## 2023-05-28 ENCOUNTER — Ambulatory Visit
Admission: RE | Admit: 2023-05-28 | Discharge: 2023-05-28 | Disposition: A | Discharge status: Home / Self Care | Attending: Family Medicine | Admitting: Family Medicine

## 2023-05-28 DIAGNOSIS — M25541 Pain in joints of right hand: Secondary | ICD-10-CM

## 2023-05-29 ENCOUNTER — Other Ambulatory Visit: Payer: Self-pay | Admitting: Family Medicine

## 2023-05-29 DIAGNOSIS — K769 Liver disease, unspecified: Secondary | ICD-10-CM

## 2023-06-13 ENCOUNTER — Ambulatory Visit
Admission: RE | Admit: 2023-06-13 | Discharge: 2023-06-13 | Disposition: A | Discharge status: Home / Self Care | Source: Ambulatory Visit | Attending: Family Medicine | Admitting: Family Medicine

## 2023-06-13 DIAGNOSIS — K769 Liver disease, unspecified: Secondary | ICD-10-CM | POA: Diagnosis present

## 2023-06-13 MED ORDER — GADOBUTROL 1 MMOL/ML IV SOLN
7.0000 mL | Freq: Once | INTRAVENOUS | Status: AC | PRN
  Administered 2023-06-13: 7 mL via INTRAVENOUS

## 2023-07-23 ENCOUNTER — Ambulatory Visit: Admit: 2023-07-23 | Admitting: Ophthalmology

## 2023-07-23 SURGERY — PHACOEMULSIFICATION, CATARACT, WITH IOL INSERTION
Anesthesia: Topical | Laterality: Right

## 2023-08-20 ENCOUNTER — Ambulatory Visit: Admit: 2023-08-20 | Admitting: Ophthalmology

## 2023-08-20 SURGERY — PHACOEMULSIFICATION, CATARACT, WITH IOL INSERTION
Anesthesia: Topical | Laterality: Left

## 2023-12-17 ENCOUNTER — Ambulatory Visit: Payer: Self-pay | Admitting: Surgery
